# Patient Record
Sex: Male | Born: 1950 | Marital: Married | Smoking: Never smoker
Health system: Southern US, Community
[De-identification: ages and names within clinical notes are randomized; demographics above are authoritative.]

---

## 2019-12-03 DIAGNOSIS — D500 Iron deficiency anemia secondary to blood loss (chronic): Secondary | ICD-10-CM | POA: Insufficient documentation

## 2019-12-03 DIAGNOSIS — C61 Malignant neoplasm of prostate: Secondary | ICD-10-CM | POA: Insufficient documentation

## 2020-05-22 ENCOUNTER — Encounter: Payer: Self-pay | Admitting: Urology

## 2020-05-22 ENCOUNTER — Ambulatory Visit (INDEPENDENT_AMBULATORY_CARE_PROVIDER_SITE_OTHER): Payer: Medicare Other | Admitting: Urology

## 2020-05-22 ENCOUNTER — Other Ambulatory Visit: Payer: Self-pay

## 2020-05-22 VITALS — BP 105/66 | HR 78 | Ht 75.0 in | Wt 150.0 lb

## 2020-05-22 DIAGNOSIS — N529 Male erectile dysfunction, unspecified: Secondary | ICD-10-CM

## 2020-05-22 DIAGNOSIS — C61 Malignant neoplasm of prostate: Secondary | ICD-10-CM

## 2020-05-22 DIAGNOSIS — N3281 Overactive bladder: Secondary | ICD-10-CM

## 2020-05-22 MED ORDER — TAMSULOSIN HCL 0.4 MG PO CAPS: 0.4000 mg | ORAL_CAPSULE | Freq: Every day | ORAL | 3 refills | Status: AC

## 2020-05-22 MED ORDER — TADALAFIL 5 MG PO TABS: ORAL_TABLET | ORAL | 11 refills | Status: DC

## 2020-05-22 NOTE — Patient Instructions (Signed)
Overactive Bladder, Adult  Overactive bladder refers to a condition in which a person has a sudden need to pass urine. The person may leak urine if he or she cannot get to the bathroom fast enough (urinary incontinence). A person with this condition may also wake up several times in the night to go to the bathroom. Overactive bladder is associated with poor nerve signals between your bladder and your brain. Your bladder may get the signal to empty before it is full. You may also have very sensitive muscles that make your bladder squeeze too soon. These symptoms might interfere with daily work or social activities. What are the causes? This condition may be associated with or caused by:  Urinary tract infection.  Infection of nearby tissues, such as the prostate.  Prostate enlargement.  Surgery on the uterus or urethra.  Bladder stones, inflammation, or tumors.  Drinking too much caffeine or alcohol.  Certain medicines, especially medicines that get rid of extra fluid in the body (diuretics).  Muscle or nerve weakness, especially from: ? A spinal cord injury. ? Stroke. ? Multiple sclerosis. ? Parkinson's disease.  Diabetes.  Constipation. What increases the risk? You may be at greater risk for overactive bladder if you:  Are an older adult.  Smoke.  Are going through menopause.  Have prostate problems.  Have a neurological disease, such as stroke, dementia, Parkinson's disease, or multiple sclerosis (MS).  Eat or drink things that irritate the bladder. These include alcohol, spicy food, and caffeine.  Are overweight or obese. What are the signs or symptoms? Symptoms of this condition include:  Sudden, strong urge to urinate.  Leaking urine.  Urinating 8 or more times a day.  Waking up to urinate 2 or more times a night. How is this diagnosed? Your health care provider may suspect overactive bladder based on your symptoms. He or she will diagnose this condition  by:  A physical exam and medical history.  Blood or urine tests. You might need bladder or urine tests to help determine what is causing your overactive bladder. You might also need to see a health care provider who specializes in urinary tract problems (urologist). How is this treated? Treatment for overactive bladder depends on the cause of your condition and whether it is mild or severe. You can also make lifestyle changes at home. Options include:  Bladder training. This may include: ? Learning to control the urge to urinate by following a schedule that directs you to urinate at regular intervals (timed voiding). ? Doing Kegel exercises to strengthen your pelvic floor muscles, which support your bladder. Toning these muscles can help you control urination, even if your bladder muscles are overactive.  Special devices. This may include: ? Biofeedback, which uses sensors to help you become aware of your body's signals. ? Electrical stimulation, which uses electrodes placed inside the body (implanted) or outside the body. These electrodes send gentle pulses of electricity to strengthen the nerves or muscles that control the bladder. ? Women may use a plastic device that fits into the vagina and supports the bladder (pessary).  Medicines. ? Antibiotics to treat bladder infection. ? Antispasmodics to stop the bladder from releasing urine at the wrong time. ? Tricyclic antidepressants to relax bladder muscles. ? Injections of botulinum toxin type A directly into the bladder tissue to relax bladder muscles.  Lifestyle changes. This may include: ? Weight loss. Talk to your health care provider about weight loss methods that would work best for you. ?   Diet changes. This may include reducing how much alcohol and caffeine you consume, or drinking fluids at different times of the day. ? Not smoking. Do not use any products that contain nicotine or tobacco, such as cigarettes and e-cigarettes. If  you need help quitting, ask your health care provider.  Surgery. ? A device may be implanted to help manage the nerve signals that control urination. ? An electrode may be implanted to stimulate electrical signals in the bladder. ? A procedure may be done to change the shape of the bladder. This is done only in very severe cases. Follow these instructions at home: Lifestyle  Make any diet or lifestyle changes that are recommended by your health care provider. These may include: ? Drinking less fluid or drinking fluids at different times of the day. ? Cutting down on caffeine or alcohol. ? Doing Kegel exercises. ? Losing weight if needed. ? Eating a healthy and balanced diet to prevent constipation. This may include:  Eating foods that are high in fiber, such as fresh fruits and vegetables, whole grains, and beans.  Limiting foods that are high in fat and processed sugars, such as fried and sweet foods. General instructions  Take over-the-counter and prescription medicines only as told by your health care provider.  If you were prescribed an antibiotic medicine, take it as told by your health care provider. Do not stop taking the antibiotic even if you start to feel better.  Use any implants or pessary as told by your health care provider.  If needed, wear pads to absorb urine leakage.  Keep a journal or log to track how much and when you drink and when you feel the need to urinate. This will help your health care provider monitor your condition.  Keep all follow-up visits as told by your health care provider. This is important. Contact a health care provider if:  You have a fever.  Your symptoms do not get better with treatment.  Your pain and discomfort get worse.  You have more frequent urges to urinate. Get help right away if:  You are not able to control your bladder. Summary  Overactive bladder refers to a condition in which a person has a sudden need to pass  urine.  Several conditions may lead to an overactive bladder.  Treatment for overactive bladder depends on the cause and severity of your condition.  Follow your health care provider's instructions about lifestyle changes, doing Kegel exercises, keeping a journal, and taking medicines. This information is not intended to replace advice given to you by your health care provider. Make sure you discuss any questions you have with your health care provider. Document Revised: 11/09/2018 Document Reviewed: 08/04/2017 Elsevier Patient Education  [place]. Tadalafil tablets (Cialis) What is this medicine? TADALAFIL (tah DA la fil) is used to treat erection problems in men. It is also used for enlargement of the prostate gland in men, a condition called benign prostatic hyperplasia or BPH. This medicine improves urine flow and reduces BPH symptoms. This medicine can also treat both erection problems and BPH when they occur together. This medicine may be used for other purposes; ask your health care provider or pharmacist if you have questions. COMMON BRAND NAME(S): Kathaleen Bury, Cialis What should I tell my health care provider before I take this medicine? They need to know if you have any of these conditions:  bleeding disorders  eye or vision problems, including a rare inherited eye disease called retinitis pigmentosa  anatomical deformation of the penis, Peyronie's disease, or history of priapism (painful and prolonged erection)  heart disease, angina, a history of heart attack, irregular heart beats, or other heart problems  high or low blood pressure  history of blood diseases, like sickle cell anemia or leukemia  history of stomach bleeding  kidney disease  liver disease  stroke  an unusual or allergic reaction to tadalafil, other medicines, foods, dyes, or preservatives  pregnant or trying to get pregnant  breast-feeding How should I use this medicine? Take this  medicine by mouth with a glass of water. Follow the directions on the prescription label. You may take this medicine with or without meals. When this medicine is used for erection problems, your doctor may prescribe it to be taken once daily or as needed. If you are taking the medicine as needed, you may be able to have sexual activity 30 minutes after taking it and for up to 36 hours after taking it. Whether you are taking the medicine as needed or once daily, you should not take more than one dose per day. If you are taking this medicine for symptoms of benign prostatic hyperplasia (BPH) or to treat both BPH and an erection problem, take the dose once daily at about the same time each day. Do not take your medicine more often than directed. Talk to your pediatrician regarding the use of this medicine in children. Special care may be needed. Overdosage: If you think you have taken too much of this medicine contact a poison control center or emergency room at once. NOTE: This medicine is only for you. Do not share this medicine with others. What if I miss a dose? If you are taking this medicine as needed for erection problems, this does not apply. If you miss a dose while taking this medicine once daily for an erection problem, benign prostatic hyperplasia, or both, take it as soon as you remember, but do not take more than one dose per day. What may interact with this medicine? Do not take this medicine with any of the following medications:  nitrates like amyl nitrite, isosorbide dinitrate, isosorbide mononitrate, nitroglycerin  other medicines for erectile dysfunction like avanafil, sildenafil, vardenafil  other tadalafil products (Adcirca)  riociguat This medicine may also interact with the following medications:  certain drugs for high blood pressure  certain drugs for the treatment of HIV infection or AIDS  certain drugs used for fungal or yeast infections, like fluconazole, itraconazole,  ketoconazole, and voriconazole  certain drugs used for seizures like carbamazepine, phenytoin, and phenobarbital  grapefruit juice  macrolide antibiotics like clarithromycin, erythromycin, troleandomycin  medicines for prostate problems  rifabutin, rifampin or rifapentine This list may not describe all possible interactions. Give your health care provider a list of all the medicines, herbs, non-prescription drugs, or dietary supplements you use. Also tell them if you smoke, drink alcohol, or use illegal drugs. Some items may interact with your medicine. What should I watch for while using this medicine? If you notice any changes in your vision while taking this drug, call your doctor or health care professional as soon as possible. Stop using this medicine and call your health care provider right away if you have a loss of sight in one or both eyes. Contact your doctor or health care professional right away if the erection lasts longer than 4 hours or if it becomes painful. This may be a sign of serious problem and must be treated right away to  prevent permanent damage. If you experience symptoms of nausea, dizziness, chest pain or arm pain upon initiation of sexual activity after taking this medicine, you should refrain from further activity and call your doctor or health care professional as soon as possible. Do not drink alcohol to excess (examples, 5 glasses of wine or 5 shots of whiskey) when taking this medicine. When taken in excess, alcohol can increase your chances of getting a headache or getting dizzy, increasing your heart rate or lowering your blood pressure. Using this medicine does not protect you or your partner against HIV infection (the virus that causes AIDS) or other sexually transmitted diseases. What side effects may I notice from receiving this medicine? Side effects that you should report to your doctor or health care professional as soon as possible:  allergic reactions  like skin rash, itching or hives, swelling of the face, lips, or tongue  breathing problems  changes in hearing  changes in vision  chest pain  fast, irregular heartbeat  prolonged or painful erection  seizures Side effects that usually do not require medical attention (report to your doctor or health care professional if they continue or are bothersome):  back pain  dizziness  flushing  headache  indigestion  muscle aches  nausea  stuffy or runny nose This list may not describe all possible side effects. Call your doctor for medical advice about side effects. You may report side effects to FDA at 1-800-FDA-1088. Where should I keep my medicine? Keep out of the reach of children. Store at room temperature between 15 and 30 degrees C (59 and 86 degrees F). Throw away any unused medicine after the expiration date. NOTE: This sheet is a summary. It may not cover all possible information. If you have questions about this medicine, talk to your doctor, pharmacist, or health care provider.  2020 Elsevier/Gold Standard (2013-12-07 13:15:49)

## 2020-05-22 NOTE — Progress Notes (Signed)
   05/22/20 2:23 PM   Jahlen Marvel Plan 21-Nov-1950 [id number]  CC: History of prostate cancer, ED, urinary symptoms  HPI: I saw Mr. Mehlhoff for the above issues.  He recently moved to the area from [place].  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  His PSA was mildly elevated at 4.16 which prompted a prostate biopsy showing Gleason score 3+4 equal 7 and 4/12 cores at the right base with 25% max core involvement.  His urologist in [place] was Dr. Jon Billings.  His primary complaint today is urinary frequency and vague bladder discomfort with urination, this was present even before he had radiation.  He drinks primarily green tea and water during the day.  He has been on Flomax long-term which she does think improves his urinary symptoms.  He also has nocturia 2-3 times overnight.  He denies any incontinence, UTIs, or gross hematuria.  He also has had trouble with erections over the last few years, and is interested in trying medications.  He is never tried any medications for this previously.  Finally, he has had some anemia and leukopenia since radiation, and wonders if this could be related to his radiation.  A testosterone was checked last July 2020 and was normal at 355.  Most recent PSA on 12/03/2019 was 0.12, PSA was 0.51 in July 2020, and 0.3 in November 2019.   Physical Exam: BP 105/66 (BP Location: Left Arm, Patient Position: Sitting, Cuff Size: Normal)   Pulse 78   Ht 6\' 3"  (1.905 m)   Wt 150 lb (68 kg)   BMI 18.75 kg/m    Constitutional:  Alert and oriented, No acute distress. Cardiovascular: No clubbing, cyanosis, or edema. Respiratory: Normal respiratory effort, no increased work of breathing. GI: Abdomen is soft, nontender, nondistended, no abdominal masses  Laboratory Data: Reviewed, see HPI  Pertinent Imaging: None to  review  Assessment & Plan:   In summary, is a 70 year old male with a history of favorable intermediate risk prostate cancer treated with brachytherapy and external beam radiation in [place] in early 2018, and PSA has been appropriately low since that time, most recently 0.12 in May 2021.  He has mild to moderate urinary frequency and bladder discomfort that was present even before radiation, prior urinalyses have been benign.  We discussed behavioral strategies including avoiding bladder irritants like tea and timed voiding.  Regarding his ED, I recommended a trial of Cialis, and we discussed the risks and benefits of this medication at length.  -Continue Flomax, refilled -Trial of Cialis 5 to 10 mg on demand for ED -Follow-up for lab visit for AM testosterone, call with results and to check on urinary symptoms.  If urinary symptoms increasingly bothersome would recommend cystoscopy for further evaluation, or consider trial of an anticholinergic   Nickolas Madrid, MD 05/22/2020  [facility] [address] [place], [place] [address] [phone]

## 2020-06-04 ENCOUNTER — Other Ambulatory Visit: Payer: Medicare Other

## 2020-06-04 ENCOUNTER — Other Ambulatory Visit: Payer: Self-pay

## 2020-06-04 DIAGNOSIS — C61 Malignant neoplasm of prostate: Secondary | ICD-10-CM

## 2020-06-05 ENCOUNTER — Telehealth: Payer: Self-pay

## 2020-06-05 LAB — TESTOSTERONE: Testosterone: 434 ng/dL (ref 264–916)

## 2020-06-05 NOTE — Telephone Encounter (Signed)
-----   Message from Billey Co, MD sent at 06/05/2020  8:03 AM EDT ----- Testosterone level is normal.  If his urinary symptoms are not improving with behavioral strategies that we discussed, okay to schedule cystoscopy if he would like to pursue further work-up for his urinary symptoms.  Nickolas Madrid, MD 06/05/2020

## 2020-06-05 NOTE — Telephone Encounter (Signed)
Called pt informed him of the information below. Pt gave verbal understanding. Declines appt at this time, will call back if needed in the future.

## 2020-09-16 ENCOUNTER — Other Ambulatory Visit: Payer: Self-pay | Admitting: Internal Medicine

## 2020-09-16 DIAGNOSIS — R1031 Right lower quadrant pain: Secondary | ICD-10-CM

## 2020-09-25 ENCOUNTER — Ambulatory Visit
Admission: RE | Admit: 2020-09-25 | Discharge: 2020-09-25 | Disposition: A | Discharge status: Home / Self Care | Payer: Medicare Other | Source: Ambulatory Visit | Attending: Internal Medicine | Admitting: Internal Medicine

## 2020-09-25 ENCOUNTER — Other Ambulatory Visit: Payer: Self-pay

## 2020-09-25 DIAGNOSIS — R1031 Right lower quadrant pain: Secondary | ICD-10-CM | POA: Insufficient documentation

## 2020-10-01 ENCOUNTER — Other Ambulatory Visit: Payer: Self-pay

## 2020-10-01 ENCOUNTER — Ambulatory Visit: Payer: Self-pay

## 2020-10-01 ENCOUNTER — Other Ambulatory Visit: Payer: Self-pay | Admitting: Occupational Medicine

## 2020-10-01 DIAGNOSIS — Z0000 Encounter for general adult medical examination without abnormal findings: Secondary | ICD-10-CM

## 2021-05-25 DIAGNOSIS — N529 Male erectile dysfunction, unspecified: Secondary | ICD-10-CM | POA: Insufficient documentation

## 2021-12-24 ENCOUNTER — Other Ambulatory Visit: Payer: Self-pay | Admitting: Internal Medicine

## 2021-12-24 DIAGNOSIS — R109 Unspecified abdominal pain: Secondary | ICD-10-CM

## 2021-12-24 DIAGNOSIS — R1013 Epigastric pain: Secondary | ICD-10-CM

## 2021-12-24 DIAGNOSIS — R634 Abnormal weight loss: Secondary | ICD-10-CM

## 2021-12-31 ENCOUNTER — Ambulatory Visit
Admission: RE | Admit: 2021-12-31 | Discharge: 2021-12-31 | Disposition: A | Discharge status: Home / Self Care | Payer: Medicare Other | Source: Ambulatory Visit | Attending: Internal Medicine | Admitting: Internal Medicine

## 2021-12-31 DIAGNOSIS — R634 Abnormal weight loss: Secondary | ICD-10-CM

## 2021-12-31 DIAGNOSIS — R1013 Epigastric pain: Secondary | ICD-10-CM

## 2021-12-31 DIAGNOSIS — R109 Unspecified abdominal pain: Secondary | ICD-10-CM

## 2021-12-31 MED ORDER — IOPAMIDOL (ISOVUE-300) INJECTION 61%
100.0000 mL | Freq: Once | INTRAVENOUS | Status: AC | PRN
  Administered 2021-12-31: 100 mL via INTRAVENOUS

## 2022-01-02 IMAGING — US US EXTREM LOW*R* LIMITED
1 series · 14 of 15 positions shown · non-contrast
Comparison: None.

CLINICAL DATA: Right inguinal region pain

EXAM:
ULTRASOUND RIGHT INGUINAL REGION
TECHNIQUE: Longitudinal and transverse images of the right inguinal region
evident.

[Series 1: us extrem low*right* limited · 0.07mm/px · 15 acquisitions, 14 frames shown]
[im 1/15]
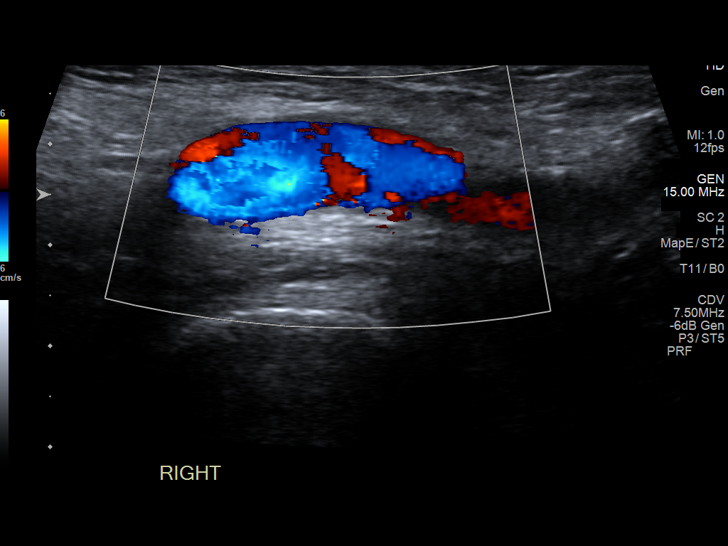
[im 2/15]
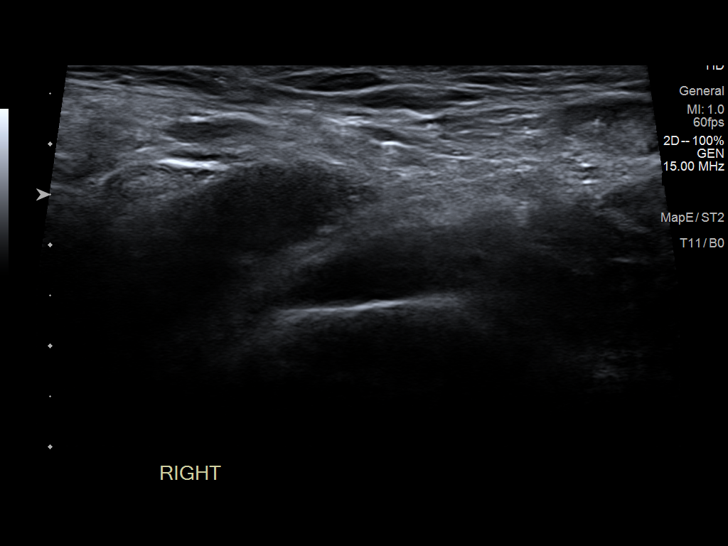
[im 3/15]
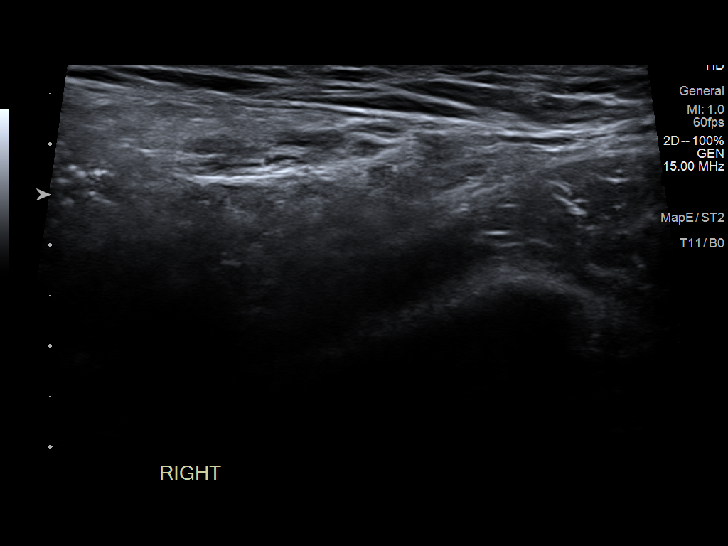
[im 4/15]
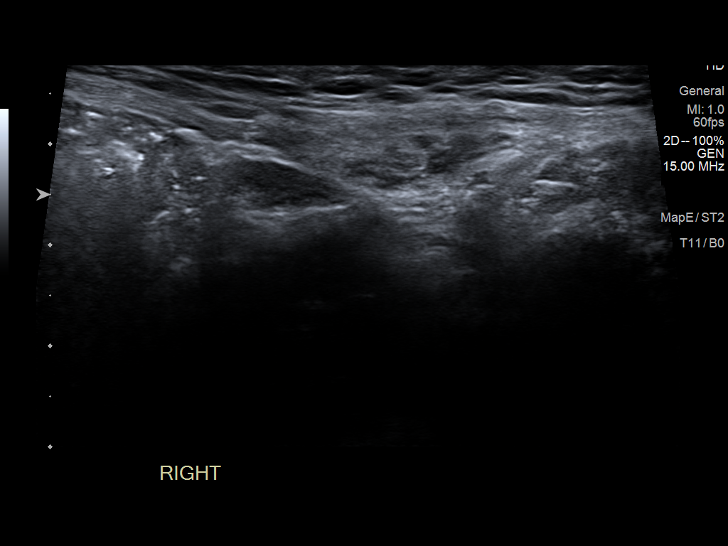
[im 5/15]
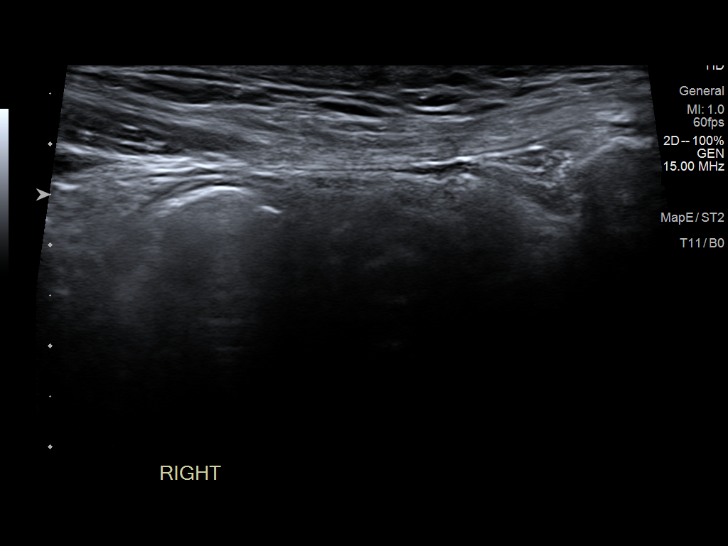
[im 6/15]
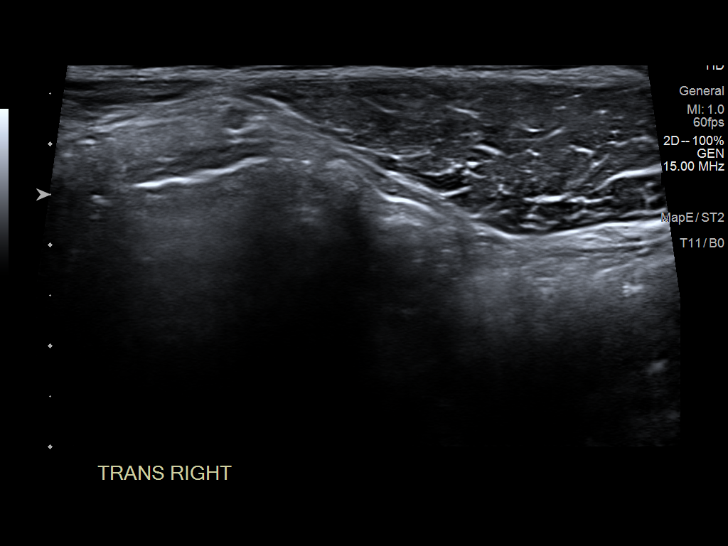
[im 7/15]
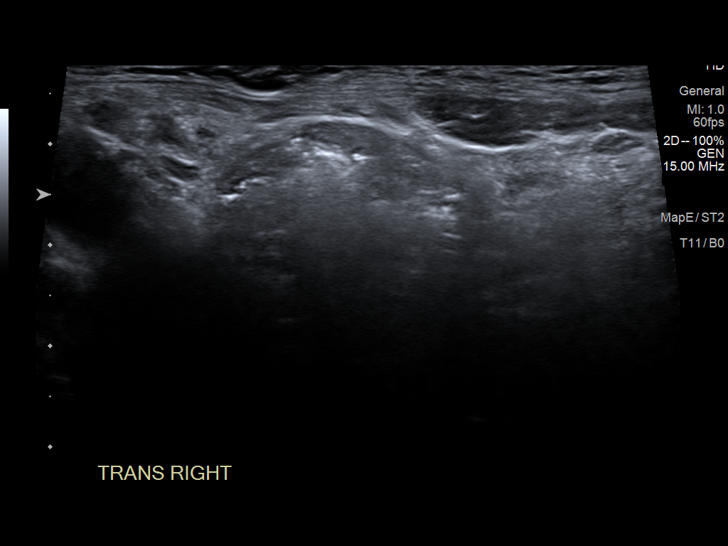
[im 9/15]
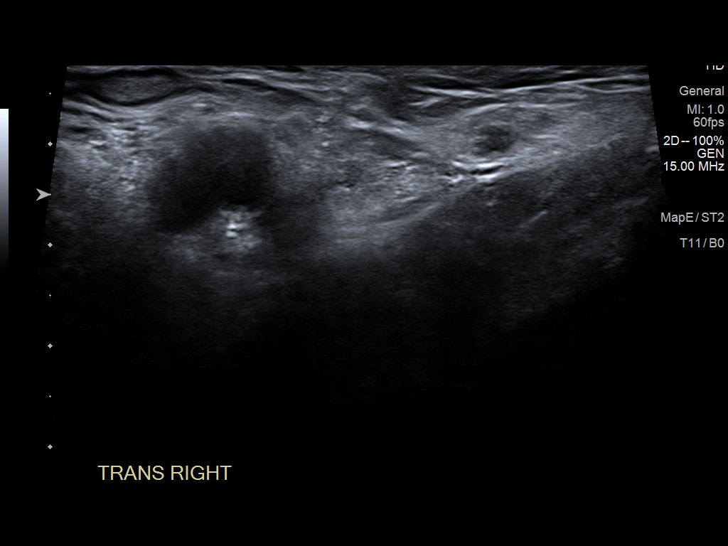
[im 10/15]
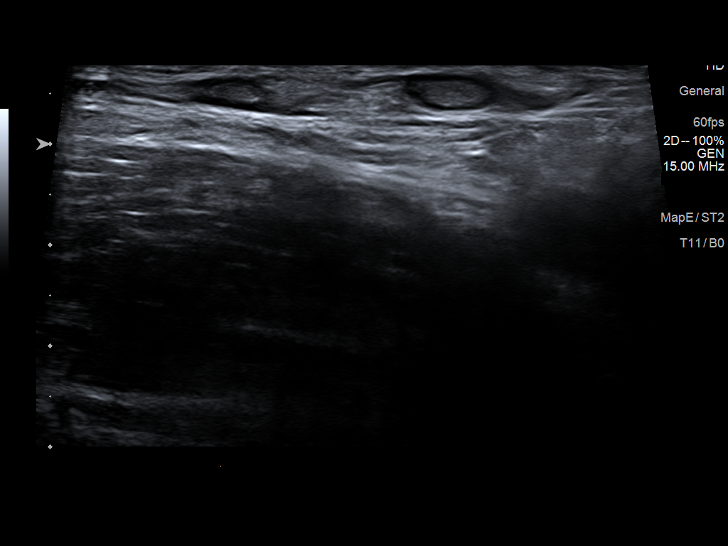
[im 11/15]
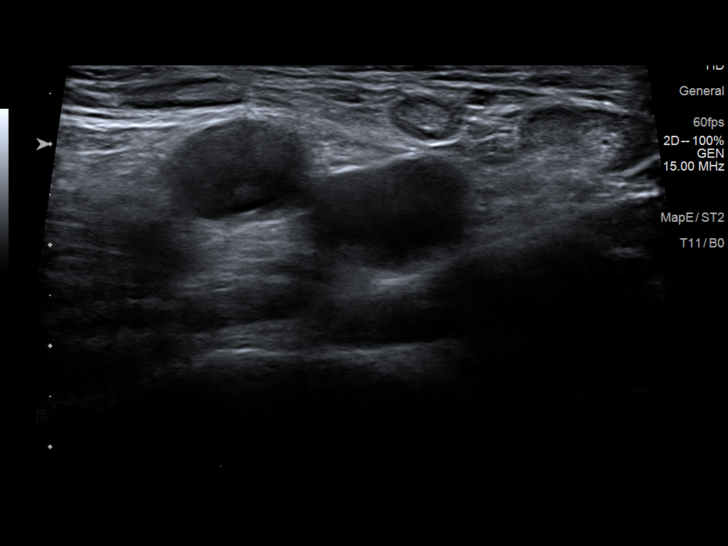
[im 12/15]
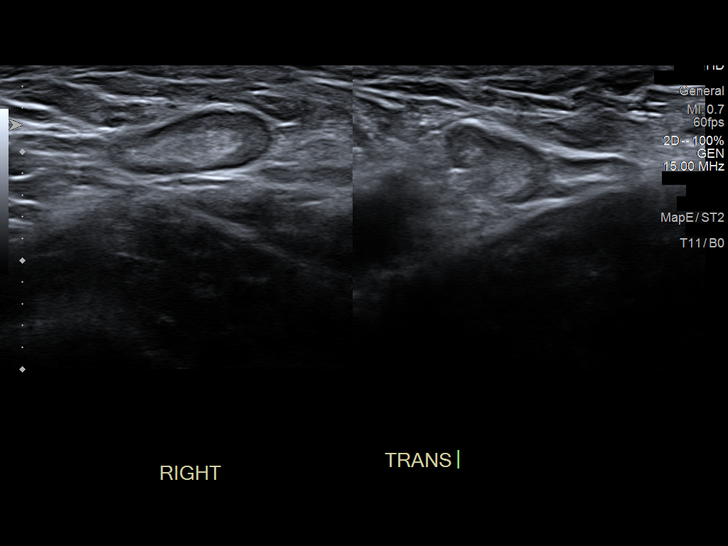
[im 13/15]
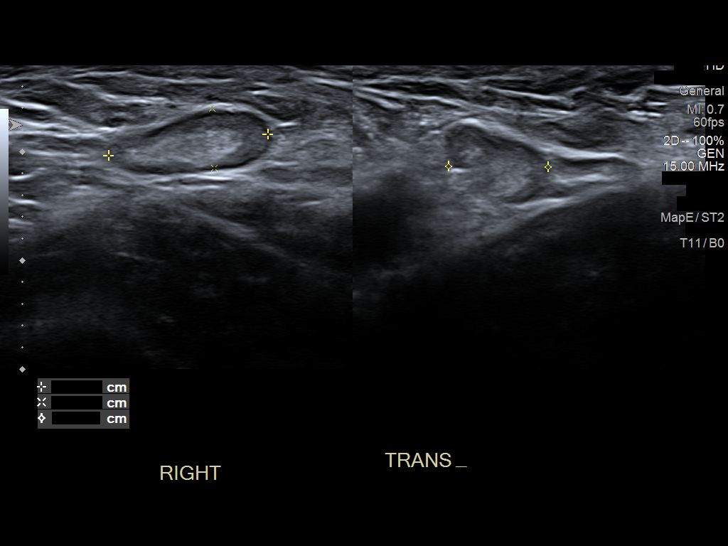
[im 14/15]
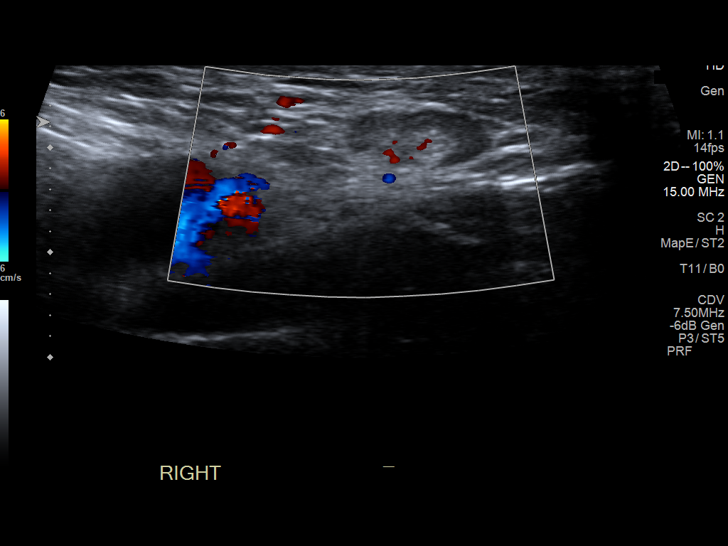
[im 15/15]
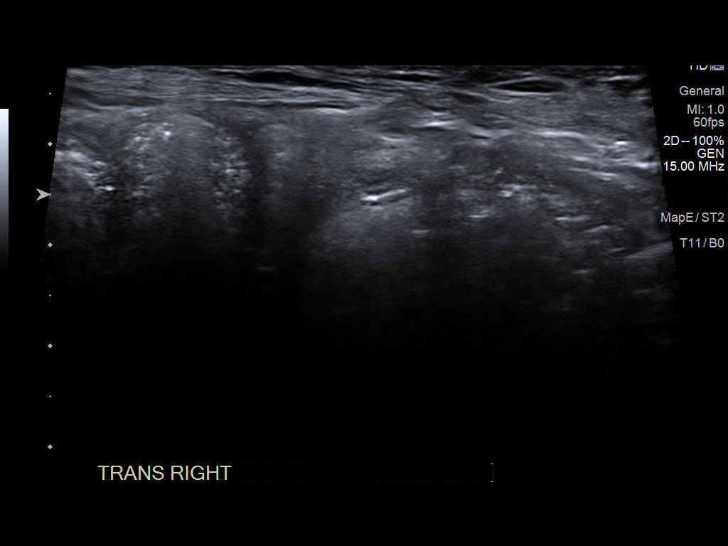

[14 of 15 positions shown; findings below may reference images not displayed]

FINDINGS: No evident hernia in the right inguinal region by ultrasound. There
are several benign-appearing lymph nodes in the right inguinal
region. These lymph nodes have an ovoid shape with central fatty
hila. Largest of these lymph nodes measures 1.5 x 0.6 x 0.9 cm. No
fluid or inflammatory foci in the right inguinal region.
IMPRESSION: Several benign-appearing lymph nodes in the right inguinal region,
likely of reactive etiology. No mass apart from lymph nodes evident.
No inflammatory focus or abnormal fluid. No hernia appreciable by
ultrasound.

## 2022-01-08 IMAGING — DX DG CHEST 2V
2 series · 2 of 2 positions shown · non-contrast
Comparison: None.

CLINICAL DATA: Physical exam.  Occupational exposure

EXAM:
CHEST - 2 VIEW

[chest pa]
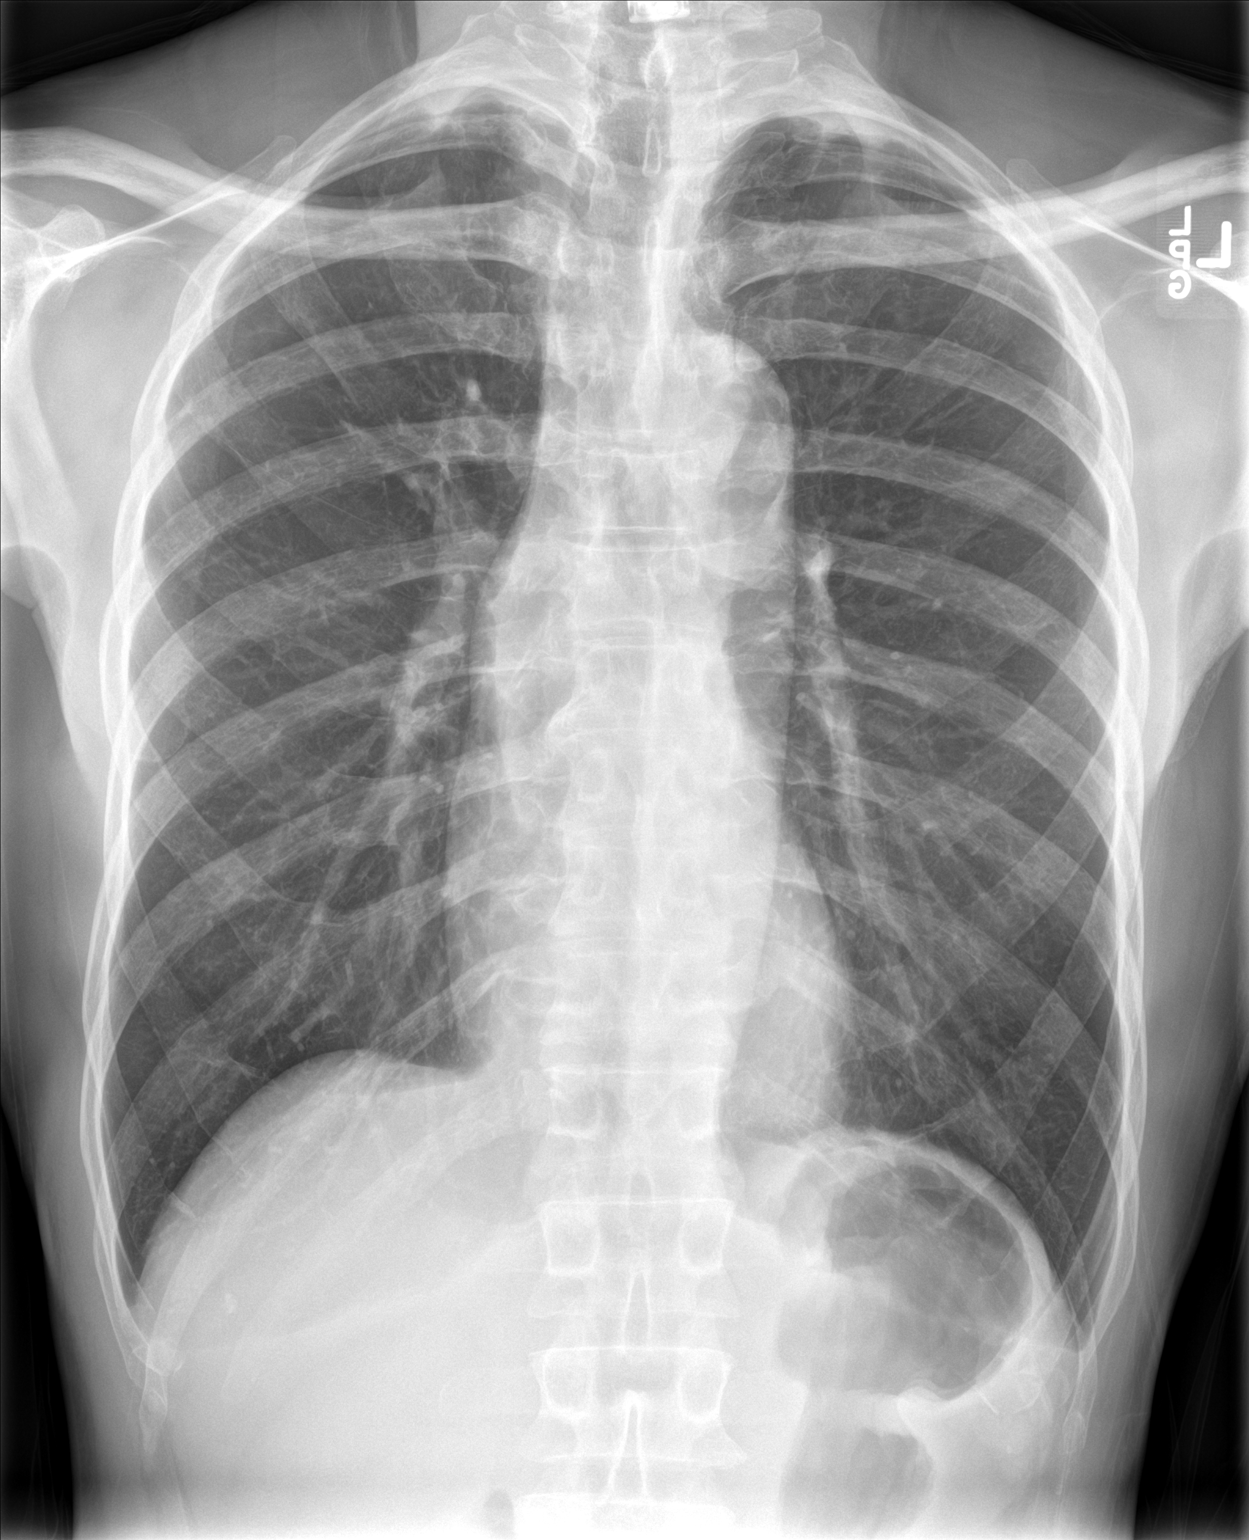

[chest lat]
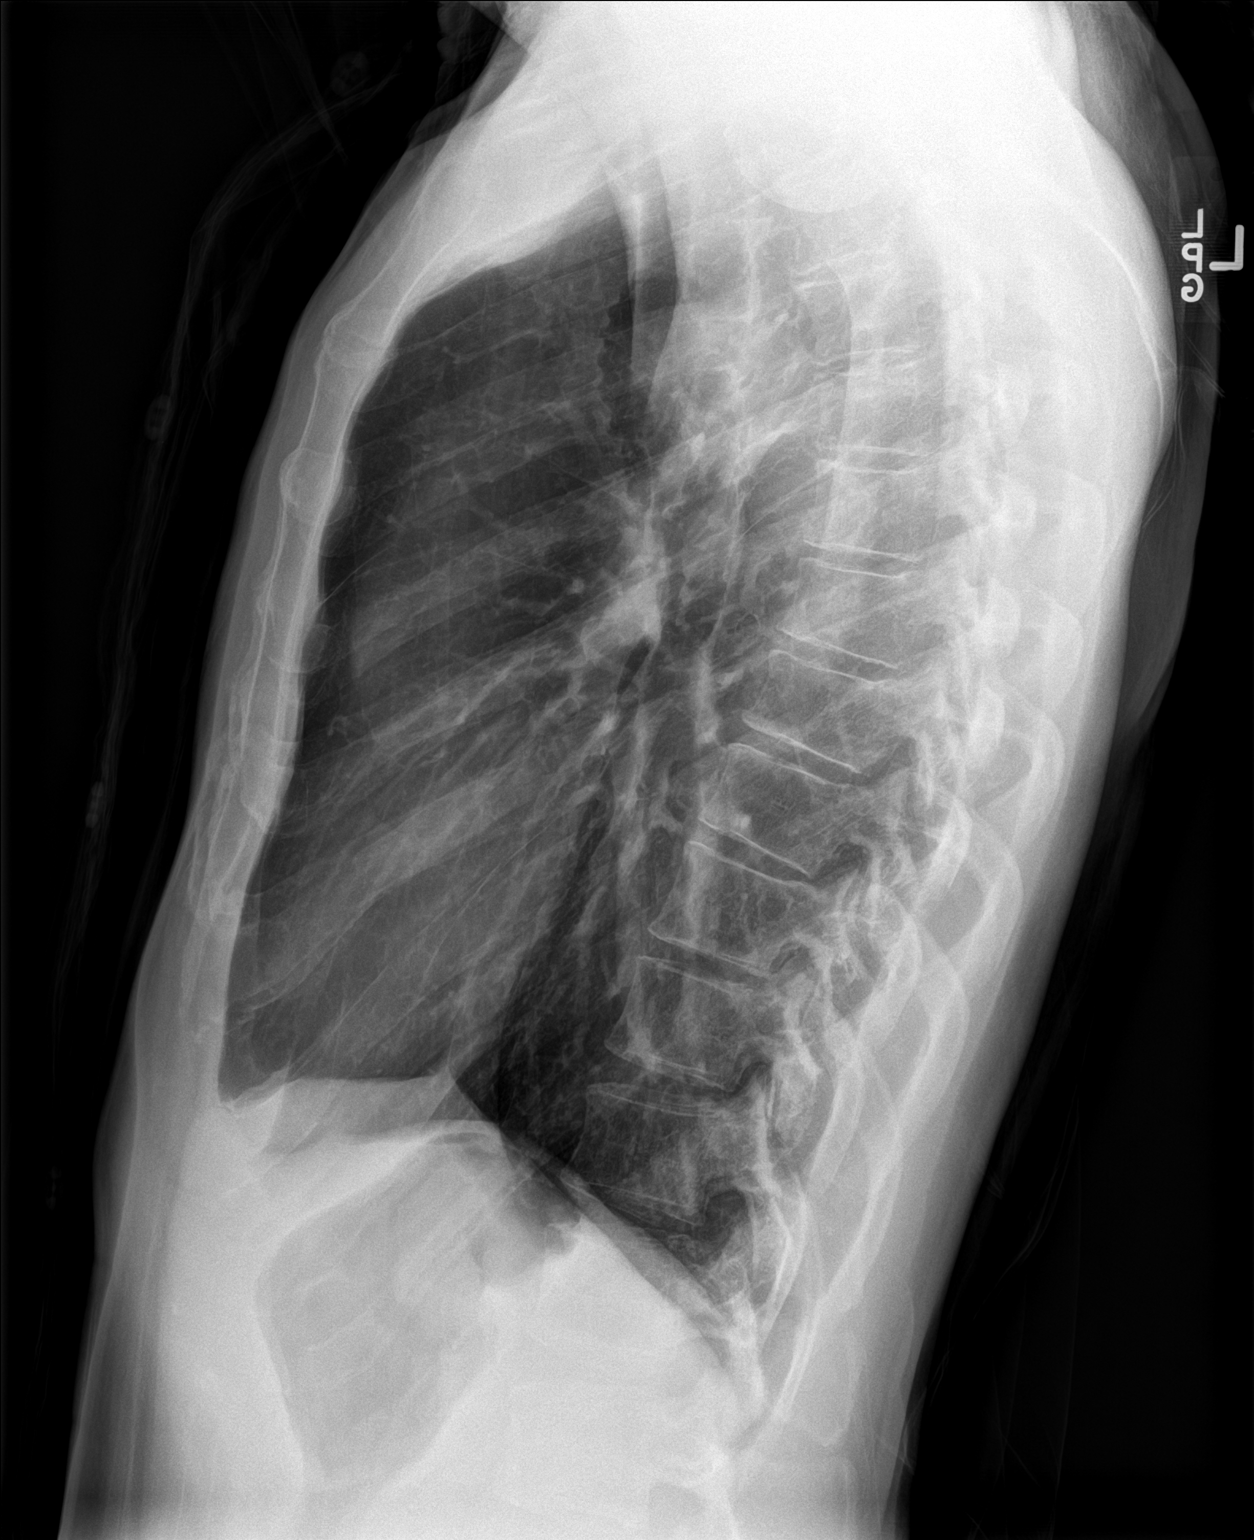

[2 of 2 positions shown; findings below may reference images not displayed]

FINDINGS: Normal heart size and mediastinal contours. Symmetric biapical
pleural thickening/scarring. There is no edema, consolidation,
effusion, or pneumothorax. No significant osseous finding.
IMPRESSION: Negative chest.

## 2022-01-19 ENCOUNTER — Other Ambulatory Visit: Payer: Self-pay

## 2022-01-19 ENCOUNTER — Inpatient Hospital Stay: Payer: Medicare Other | Attending: Internal Medicine | Admitting: Internal Medicine

## 2022-01-19 ENCOUNTER — Encounter: Payer: Self-pay | Admitting: Internal Medicine

## 2022-01-19 ENCOUNTER — Inpatient Hospital Stay: Payer: Medicare Other

## 2022-01-19 VITALS — BP 110/73 | HR 61 | Temp 98.7°F | Resp 20 | Wt 147.2 lb

## 2022-01-19 DIAGNOSIS — Z8546 Personal history of malignant neoplasm of prostate: Secondary | ICD-10-CM | POA: Insufficient documentation

## 2022-01-19 DIAGNOSIS — D708 Other neutropenia: Secondary | ICD-10-CM | POA: Diagnosis not present

## 2022-01-19 DIAGNOSIS — D72810 Lymphocytopenia: Secondary | ICD-10-CM | POA: Insufficient documentation

## 2022-01-19 DIAGNOSIS — D649 Anemia, unspecified: Secondary | ICD-10-CM

## 2022-01-19 DIAGNOSIS — D72818 Other decreased white blood cell count: Secondary | ICD-10-CM | POA: Diagnosis not present

## 2022-01-19 DIAGNOSIS — E611 Iron deficiency: Secondary | ICD-10-CM | POA: Diagnosis not present

## 2022-01-19 LAB — HEPATITIS B CORE ANTIBODY, IGM: Hep B C IgM: NONREACTIVE

## 2022-01-19 LAB — CBC WITH DIFFERENTIAL/PLATELET
Abs Immature Granulocytes: 0 10*3/uL (ref 0.00–0.07)
Basophils Absolute: 0 10*3/uL (ref 0.0–0.1)
Basophils Relative: 0 %
Eosinophils Absolute: 0 10*3/uL (ref 0.0–0.5)
Eosinophils Relative: 1 %
HCT: 36 % — ABNORMAL LOW (ref 39.0–52.0)
Hemoglobin: 12 g/dL — ABNORMAL LOW (ref 13.0–17.0)
Immature Granulocytes: 0 %
Lymphocytes Relative: 31 %
Lymphs Abs: 1.1 10*3/uL (ref 0.7–4.0)
MCH: 30.9 pg (ref 26.0–34.0)
MCHC: 33.3 g/dL (ref 30.0–36.0)
MCV: 92.8 fL (ref 80.0–100.0)
Monocytes Absolute: 0.4 10*3/uL (ref 0.1–1.0)
Monocytes Relative: 10 %
Neutro Abs: 2 10*3/uL (ref 1.7–7.7)
Neutrophils Relative %: 58 %
Platelets: 179 10*3/uL (ref 150–400)
RBC: 3.88 MIL/uL — ABNORMAL LOW (ref 4.22–5.81)
RDW: 14.3 % (ref 11.5–15.5)
WBC: 3.5 10*3/uL — ABNORMAL LOW (ref 4.0–10.5)
nRBC: 0 % (ref 0.0–0.2)

## 2022-01-19 LAB — TECHNOLOGIST SMEAR REVIEW
Plt Morphology: NORMAL
RBC MORPHOLOGY: NORMAL
WBC MORPHOLOGY: NORMAL

## 2022-01-19 LAB — HEPATITIS B SURFACE ANTIGEN: Hepatitis B Surface Ag: NONREACTIVE

## 2022-01-19 LAB — RETICULOCYTES
Immature Retic Fract: 4.7 % (ref 2.3–15.9)
RBC.: 3.84 MIL/uL — ABNORMAL LOW (ref 4.22–5.81)
Retic Count, Absolute: 45.7 10*3/uL (ref 19.0–186.0)
Retic Ct Pct: 1.2 % (ref 0.4–3.1)

## 2022-01-19 LAB — VITAMIN B12: Vitamin B-12: 1233 pg/mL — ABNORMAL HIGH (ref 180–914)

## 2022-01-19 LAB — IRON AND TIBC
Iron: 102 ug/dL (ref 45–182)
Saturation Ratios: 30 % (ref 17.9–39.5)
TIBC: 346 ug/dL (ref 250–450)
UIBC: 244 ug/dL

## 2022-01-19 LAB — FERRITIN: Ferritin: 35 ng/mL (ref 24–336)

## 2022-01-19 LAB — LACTATE DEHYDROGENASE: LDH: 119 U/L (ref 98–192)

## 2022-01-19 LAB — HEPATITIS C ANTIBODY: HCV Ab: NONREACTIVE

## 2022-01-19 NOTE — Assessment & Plan Note (Deleted)
#   Anemia:  # Etilology:   # Mild Leucopenia:   # Prostate cancer: s/p EBRT [2018]- followed Urologist, [facility] [[facility]]  Thank you Mr.Crowley for allowing me to participate in the care of your pleasant patient. Please do not hesitate to contact me with questions or concerns in the interim.    # DISPOSITION: # labs today # follow up in the week of July 3rd- MD; no labs- possible venofer-Dr.B

## 2022-01-19 NOTE — Assessment & Plan Note (Signed)
#   Anemia: Hemoglobin 11 [April 2023; iron saturation 26; ferritin 22-lower limit 23 PCP]-etiology is unclear.  Discussed the multiple etiologies include-blood loss/loss of absorption vs. nutritional slightly.  Again doubt any malignancy although patient exposed to radiation 5 years ago for his prostate cancer.  We will check CBC CMP; LDH haptoglobin reticulocyte count review of smear; iron studies ferritin B12.   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  Question external beam RT/viral infections versus others.  Doubt any serious malignancies-again await above work-up.   #Fatigue: ?  Etiology await above work-up.   # Prostate cancer: s/p EBRT [2018]- followed Urologist, [place]; PSA 2023-  Thank you Mr.Crowley for allowing me to participate in the care of your pleasant patient. Please do not hesitate to contact me with questions or concerns in the interim.  # DISPOSITION: # labs today # follow up in the week of July 3rd- MD; no labs- possible venofer-Dr.B

## 2022-01-19 NOTE — Progress Notes (Signed)
[facility] NOTE  Patient Care Team: Gladstone Lighter, MD as PCP - General (Internal Medicine) Cammie Sickle, MD as Consulting Physician (Oncology)  CHIEF COMPLAINTS/PURPOSE OF CONSULTATION: ANEMIA   HEMATOLOGY HISTORY  # ANEMIA[ APRIL- 2023-;PCP; Hb12 platelets- WBC;-2.3 [ANC/Lymphopenia]; Iron sat-24%; ferritin-22 [Lower limit- >23];  GFR-80 CT/US- ;  EGD/colonoscopy-[early 2020]- MAY 2023- [facility] [mason [facility]]  # PROSTATE CANCER [2018; "Gleason score=7"] s/p EBRT+ seed implants  HISTORY OF PRESENTING ILLNESS:  Shane [name] 71 y.o.  male pleasant patient was been referred to [place] for further evaluation of anemia.  Patient was recently evaluated by GI for abdominal discomfort; and anemia.  Patient is currently awaiting review of records from of his endoscopies from [place].   Patient admits to chronic fatigue; progressively getting worse.  Blood in stools: none Blood in urine:none Difficulty swallowing: Change of bowel movement/constipation: none Prior blood transfusion: none Prior history of blood loss: none Liver disease:none  Alcohol: none Bariatric surgery: none  Prior evaluation with hematology:none Prior bone marrow biopsy: none Oral iron: prn- causes constipation Prior IV iron infusions:none   Review of Systems  Constitutional:  Positive for malaise/fatigue. Negative for chills, diaphoresis, fever and weight loss.  HENT:  Negative for nosebleeds and sore throat.   Eyes:  Negative for double vision.  Respiratory:  Negative for cough, hemoptysis, sputum production, shortness of breath and wheezing.   Cardiovascular:  Negative for chest pain, palpitations, orthopnea and leg swelling.  Gastrointestinal:  Positive for heartburn. Negative for abdominal pain, blood in stool, constipation, diarrhea, melena, nausea and vomiting.  Genitourinary:  Negative for dysuria, frequency and urgency.  Musculoskeletal:  Positive for joint pain.  Negative for back pain.  Skin: Negative.  Negative for itching and rash.  Neurological:  Negative for dizziness, tingling, focal weakness, weakness and headaches.  Endo/Heme/Allergies:  Does not bruise/bleed easily.  Psychiatric/Behavioral:  Negative for depression. The patient is not nervous/anxious and does not have insomnia.      MEDICAL HISTORY:  History reviewed. No pertinent past medical history.  SURGICAL HISTORY: History reviewed. No pertinent surgical history.  SOCIAL HISTORY: Social History   Socioeconomic History   Marital status: Married    Spouse name: Not on file   Number of children: Not on file   Years of education: Not on file   Highest education level: Not on file  Occupational History   Not on file  Tobacco Use   Smoking status: Never   Smokeless tobacco: Never  Substance and Sexual Activity   Alcohol use: Not Currently   Drug use: Never   Sexual activity: Yes    Birth control/protection: None  Other Topics Concern   Not on file  Social History Narrative      From [place]; worked for [employer]; niece in [place]; 3 children. Never smoked; No alcohol.    Social Determinants of Health   Financial Resource Strain: Not on file  Food Insecurity: Not on file  Transportation Needs: Not on file  Physical Activity: Not on file  Stress: Not on file  Social Connections: Not on file  Intimate Partner Violence: Not on file    FAMILY HISTORY: Family History  Problem Relation Age of Onset   Diabetes Mother     ALLERGIES:  is allergic to milk protein, peanut-containing drug products, and other.  MEDICATIONS:  Current Outpatient Medications  Medication Sig Dispense Refill   acetaminophen (TYLENOL) 500 MG tablet Take by mouth as needed.      celecoxib (  CELEBREX) 200 MG capsule Take by mouth.     omeprazole (PRILOSEC) 40 MG capsule Take by mouth.     tamsulosin (FLOMAX) 0.4 MG CAPS capsule Take 1 capsule (0.4 mg total) by mouth daily. 90 capsule 3    tadalafil (CIALIS) 5 MG tablet Take 1-2 tablets by mouth as need 1hr prior to intercourse 30 tablet 11   No current facility-administered medications for this visit.     Marland Kitchen  PHYSICAL EXAMINATION:   Vitals:   01/19/22 1120  BP: 110/73  Pulse: 61  Resp: 20  Temp: 98.7 F (37.1 C)  SpO2: 100%   Filed Weights   01/19/22 1120  Weight: 147 lb 3.2 oz (66.8 kg)    Physical Exam Vitals and nursing note reviewed.  HENT:     Head: Normocephalic and atraumatic.     Mouth/Throat:     Pharynx: Oropharynx is clear.  Eyes:     Extraocular Movements: Extraocular movements intact.     Pupils: Pupils are equal, round, and reactive to light.  Cardiovascular:     Rate and Rhythm: Normal rate and regular rhythm.  Pulmonary:     Comments: Decreased breath sounds bilaterally.  Abdominal:     Palpations: Abdomen is soft.  Musculoskeletal:        General: Normal range of motion.     Cervical back: Normal range of motion.  Skin:    General: Skin is warm.  Neurological:     General: No focal deficit present.     Mental Status: He is alert and oriented to person, place, and time.  Psychiatric:        Behavior: Behavior normal.        Judgment: Judgment normal.      LABORATORY DATA:  I have reviewed the data as listed No results found for: "WBC", "HGB", "HCT", "MCV", "PLT" No results for input(s): "NA", "K", "CL", "CO2", "GLUCOSE", "BUN", "CREATININE", "CALCIUM", "GFRNONAA", "GFRAA", "PROT", "ALBUMIN", "AST", "ALT", "ALKPHOS", "BILITOT", "BILIDIR", "IBILI" in the last 8760 hours.   CT ABDOMEN PELVIS W CONTRAST  Result Date: 01/02/2022 CLINICAL DATA:  Unintentional weight loss. Recurrent epigastric pain. History of prostate cancer with brachytherapy seed implants EXAM: CT ABDOMEN AND PELVIS WITH CONTRAST TECHNIQUE: Multidetector CT imaging of the abdomen and pelvis was performed using the standard protocol following bolus administration of intravenous contrast. RADIATION DOSE REDUCTION:  This exam was performed according to the departmental dose-optimization program which includes automated exposure control, adjustment of the mA and/or kV according to patient size and/or use of iterative reconstruction technique. CONTRAST:  146m ISOVUE-300 IOPAMIDOL (ISOVUE-300) INJECTION 61% COMPARISON:  None Available. FINDINGS: Lower chest: Lung bases are clear. Hepatobiliary: No focal hepatic lesion. No biliary duct dilatation. Common bile duct is normal. Pancreas: Pancreas is normal. No ductal dilatation. No pancreatic inflammation. Spleen: Normal spleen Adrenals/urinary tract: Adrenal glands are normal. No enhancing renal cortical lesion. No renal obstruction. Ureters and bladder normal. 3.7 cm simple fluid attenuation cyst of the LEFT kidney. Three smaller simple fluid attenuation cyst of the RIGHT kidney. Stomach/Bowel: Stomach, small bowel, appendix, and cecum are normal. The colon and rectosigmoid colon are normal. Moderate volume stool throughout the colon. Vascular/Lymphatic: Abdominal aorta is normal caliber. No periportal or retroperitoneal adenopathy. No pelvic adenopathy. Reproductive: Brachytherapy seeds within the prostate gland. No pelvic lymphadenopathy. No periaortic adenopathy. Other: No free fluid. Musculoskeletal: No suspicious lytic or blastic lesions identified. Sclerotic focus in the LEFT iliac wing measuring 17 mm (image 52/2) is favored benign. Degenerative changes in  lumbar spine at L4-L5. IMPRESSION: 1. No explanation for epigastric pain or weight loss. 2. Bilateral benign Bosniak 2 renal cysts. No specific follow-up recommended. 3. Brachytherapy seeds in the prostate gland. No evidence of prostate cancer metastasis. Electronically Signed   By: Suzy Bouchard M.D.   On: 01/02/2022 09:25    ASSESSMENT & PLAN:   Symptomatic anemia # Anemia: Hemoglobin 11 [April 2023; iron saturation 26; ferritin 22-lower limit 23 PCP]-etiology is unclear.  Discussed the multiple etiologies  include-blood loss/loss of absorption vs. nutritional slightly.  Again doubt any malignancy although patient exposed to radiation 5 years ago for his prostate cancer.  We will check CBC CMP; LDH haptoglobin reticulocyte count review of smear; iron studies ferritin B12.   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  Question external beam RT/viral infections versus others.  Doubt any serious malignancies-again await above work-up.   #Fatigue: ?  Etiology await above work-up.   # Prostate cancer: s/p EBRT [2018]- followed Urologist, [place]; PSA 2023-  Thank you Mr.Crowley for allowing me to participate in the care of your pleasant patient. Please do not hesitate to contact me with questions or concerns in the interim.  # DISPOSITION: # labs today # follow up in the week of July 3rd- MD; no labs- possible venofer-Dr.B    All questions were answered. The patient knows to call the clinic with any problems, questions or concerns.    Cammie Sickle, MD 01/19/2022 12:19 PM

## 2022-01-20 LAB — HAPTOGLOBIN: Haptoglobin: 110 mg/dL (ref 32–363)

## 2022-02-01 ENCOUNTER — Inpatient Hospital Stay: Payer: Medicare Other

## 2022-02-01 ENCOUNTER — Encounter: Payer: Self-pay | Admitting: Internal Medicine

## 2022-02-01 ENCOUNTER — Inpatient Hospital Stay: Payer: Medicare Other | Attending: Internal Medicine | Admitting: Internal Medicine

## 2022-02-01 DIAGNOSIS — G4730 Sleep apnea, unspecified: Secondary | ICD-10-CM | POA: Diagnosis not present

## 2022-02-01 DIAGNOSIS — R5383 Other fatigue: Secondary | ICD-10-CM | POA: Diagnosis not present

## 2022-02-01 DIAGNOSIS — E538 Deficiency of other specified B group vitamins: Secondary | ICD-10-CM | POA: Insufficient documentation

## 2022-02-01 DIAGNOSIS — G4700 Insomnia, unspecified: Secondary | ICD-10-CM | POA: Insufficient documentation

## 2022-02-01 DIAGNOSIS — D649 Anemia, unspecified: Secondary | ICD-10-CM | POA: Insufficient documentation

## 2022-02-01 DIAGNOSIS — D72819 Decreased white blood cell count, unspecified: Secondary | ICD-10-CM | POA: Diagnosis not present

## 2022-02-01 DIAGNOSIS — Z8546 Personal history of malignant neoplasm of prostate: Secondary | ICD-10-CM | POA: Insufficient documentation

## 2022-02-01 MED FILL — Iron Sucrose Inj 20 MG/ML (Fe Equiv): INTRAVENOUS | Qty: 10 | Status: AC

## 2022-02-01 NOTE — Progress Notes (Signed)
[facility] NOTE  Patient Care Team: Gladstone Lighter, MD as PCP - General (Internal Medicine) Cammie Sickle, MD as Consulting Physician (Oncology)  CHIEF COMPLAINTS/PURPOSE OF CONSULTATION: ANEMIA   HEMATOLOGY HISTORY  # ANEMIA[ APRIL- 2023-;PCP; Hb12 platelets- WBC;-2.3 [ANC/Lymphopenia]; Iron sat-24%; ferritin-22 [Lower limit- >23];  GFR-80 CT/US- ;  EGD/colonoscopy-[early 2020; [place]]- MAY 2023- [facility] [mason Croley]; June 2023 iron saturation 30% ferritin 35; hemolysis labs negative; hepatitis B and C- July 2023-gentle iron  # PROSTATE CANCER [2018; "Gleason score=7"] s/p EBRT+ seed implants  HISTORY OF PRESENTING ILLNESS: Alone.  Ambulating independently. Shane [name] 71 y.o.  male pleasant patient with diagnosis of mild anemia is here for follow-up/review those of the blood work.  Patient currently continues to have fatigue.  Otherwise denies any blood in stools or black-colored stools.  Patient admits to chronic fatigue; progressively getting worse.  Currently taking B12 oral pills daily.  Otherwise denies any blood in stools or black-colored stools.   Review of Systems  Constitutional:  Positive for malaise/fatigue. Negative for chills, diaphoresis, fever and weight loss.  HENT:  Negative for nosebleeds and sore throat.   Eyes:  Negative for double vision.  Respiratory:  Negative for cough, hemoptysis, sputum production, shortness of breath and wheezing.   Cardiovascular:  Negative for chest pain, palpitations, orthopnea and leg swelling.  Gastrointestinal:  Positive for heartburn. Negative for abdominal pain, blood in stool, constipation, diarrhea, melena, nausea and vomiting.  Genitourinary:  Negative for dysuria, frequency and urgency.  Musculoskeletal:  Positive for joint pain. Negative for back pain.  Skin: Negative.  Negative for itching and rash.  Neurological:  Negative for dizziness, tingling, focal weakness, weakness and headaches.   Endo/Heme/Allergies:  Does not bruise/bleed easily.  Psychiatric/Behavioral:  Negative for depression. The patient is not nervous/anxious and does not have insomnia.      MEDICAL HISTORY:  History reviewed. No pertinent past medical history.  SURGICAL HISTORY: History reviewed. No pertinent surgical history.  SOCIAL HISTORY: Social History   Socioeconomic History   Marital status: Married    Spouse name: Not on file   Number of children: Not on file   Years of education: Not on file   Highest education level: Not on file  Occupational History   Not on file  Tobacco Use   Smoking status: Never   Smokeless tobacco: Never  Substance and Sexual Activity   Alcohol use: Not Currently   Drug use: Never   Sexual activity: Yes    Birth control/protection: None  Other Topics Concern   Not on file  Social History Narrative      From [place]; worked for [employer]; niece in [place]; 3 children. Never smoked; No alcohol.    Social Determinants of Health   Financial Resource Strain: Not on file  Food Insecurity: Not on file  Transportation Needs: Not on file  Physical Activity: Not on file  Stress: Not on file  Social Connections: Not on file  Intimate Partner Violence: Not on file    FAMILY HISTORY: Family History  Problem Relation Age of Onset   Diabetes Mother     ALLERGIES:  is allergic to milk protein, peanut-containing drug products, and other.  MEDICATIONS:  Current Outpatient Medications  Medication Sig Dispense Refill   celecoxib (CELEBREX) 200 MG capsule Take by mouth.     omeprazole (PRILOSEC) 40 MG capsule Take by mouth.     tamsulosin (FLOMAX) 0.4 MG CAPS capsule Take 1 capsule (0.4 mg total) by  mouth daily. 90 capsule 3   acetaminophen (TYLENOL) 500 MG tablet Take by mouth as needed.      tadalafil (CIALIS) 5 MG tablet Take 1-2 tablets by mouth as need 1hr prior to intercourse 30 tablet 11   No current facility-administered medications for this visit.      Marland Kitchen  PHYSICAL EXAMINATION:   Vitals:   02/01/22 1400  BP: (!) 93/57  Pulse: 63  Resp: 18  Temp: 97.9 F (36.6 C)   Filed Weights   02/01/22 1400  Weight: 146 lb 11.2 oz (66.5 kg)    Physical Exam Vitals and nursing note reviewed.  HENT:     Head: Normocephalic and atraumatic.     Mouth/Throat:     Pharynx: Oropharynx is clear.  Eyes:     Extraocular Movements: Extraocular movements intact.     Pupils: Pupils are equal, round, and reactive to light.  Cardiovascular:     Rate and Rhythm: Normal rate and regular rhythm.  Pulmonary:     Comments: Decreased breath sounds bilaterally.  Abdominal:     Palpations: Abdomen is soft.  Musculoskeletal:        General: Normal range of motion.     Cervical back: Normal range of motion.  Skin:    General: Skin is warm.  Neurological:     General: No focal deficit present.     Mental Status: He is alert and oriented to person, place, and time.  Psychiatric:        Behavior: Behavior normal.        Judgment: Judgment normal.      LABORATORY DATA:  I have reviewed the data as listed Lab Results  Component Value Date   WBC 3.5 (L) 01/19/2022   HGB 12.0 (L) 01/19/2022   HCT 36.0 (L) 01/19/2022   MCV 92.8 01/19/2022   PLT 179 01/19/2022   No results for input(s): "NA", "K", "CL", "CO2", "GLUCOSE", "BUN", "CREATININE", "CALCIUM", "GFRNONAA", "GFRAA", "PROT", "ALBUMIN", "AST", "ALT", "ALKPHOS", "BILITOT", "BILIDIR", "IBILI" in the last 8760 hours.   No results found.  ASSESSMENT & PLAN:   Symptomatic anemia #Mild anemia: June 2023- hemoglobin 12; ferritin 35 iron saturation 30; LDH normal no evidence of any hemolysis.  Hepatitis work-up negative.  No clinical concerns for any MDS or leukemias at this time [prior history of radiation to prostate 2018]; Would not recommend a bone marrow biopsy based on mild hematologic abnormalities at this time.  Recommend monitoring in 6 months if worse would recommend a bone marrow biopsy.   For now recommend gentle iron 1 pill a day.  #Elevated B12-elevated; recommend PO B12  q OD.   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  White count 3.5-normal differential. ?  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  # Fatigue: ?  Unclear Etiology. ? Insomnia [2 times/urination]. Limit caffeine. Defer to PCP re: Thyroid work up.   # Prostate cancer: s/p EBRT [2018]- followed Urologist, [place]; PSA 2023-  # DISPOSITION: # NO venofer today # follow up in late FEB 2024-  MD;labs- cbc/bmp;LDH; B12 levels; iron studies;ferritin--Dr.B    All questions were answered. The patient knows to call the clinic with any problems, questions or concerns.    Cammie Sickle, MD 02/01/2022 3:53 PM

## 2022-02-01 NOTE — Progress Notes (Signed)
Patient still feeling tired and would like to discuss labs drawn at last visit.    BP 93/57 HR 63 in office today and states that is low for him.

## 2022-02-01 NOTE — Progress Notes (Signed)
Per MD no Venofer today 

## 2022-02-01 NOTE — Patient Instructions (Signed)
#  Recommend gentle iron 1 pill a day; should not upset your stomach or cause constipation.  Talk to the pharmacist if you can find it/it is over-the-counter.  

## 2022-02-01 NOTE — Assessment & Plan Note (Addendum)
#  Mild anemia: June 2023- hemoglobin 12; ferritin 35 iron saturation 30; LDH normal no evidence of any hemolysis.  Hepatitis work-up negative.  No clinical concerns for any MDS or leukemias at this time [prior history of radiation to prostate 2018]; Would not recommend a bone marrow biopsy based on mild hematologic abnormalities at this time.  Recommend monitoring in 6 months if worse would recommend a bone marrow biopsy.  For now recommend gentle iron 1 pill a day.  #Elevated B12-elevated; recommend PO B12  q OD.   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  White count 3.5-normal differential. ?  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  # Fatigue: ?  Unclear Etiology. ? Insomnia [2 times/urination]. Limit caffeine. Defer to PCP re: Thyroid work up.   # Prostate cancer: s/p EBRT [2018]- followed Urologist, [place]; PSA 2023-  # DISPOSITION: # NO venofer today # follow up in late FEB 2024-  MD;labs- cbc/bmp;LDH; B12 levels; iron studies;ferritin--Dr.B

## 2022-06-15 ENCOUNTER — Other Ambulatory Visit (HOSPITAL_COMMUNITY): Payer: Self-pay | Admitting: Internal Medicine

## 2022-06-15 DIAGNOSIS — C61 Malignant neoplasm of prostate: Secondary | ICD-10-CM

## 2022-06-15 DIAGNOSIS — Z0000 Encounter for general adult medical examination without abnormal findings: Secondary | ICD-10-CM

## 2022-06-15 DIAGNOSIS — R634 Abnormal weight loss: Secondary | ICD-10-CM

## 2022-06-29 ENCOUNTER — Ambulatory Visit
Admission: RE | Admit: 2022-06-29 | Discharge: 2022-06-29 | Disposition: A | Discharge status: Home / Self Care | Payer: Medicare Other | Source: Ambulatory Visit | Attending: Internal Medicine | Admitting: Internal Medicine

## 2022-06-29 DIAGNOSIS — R634 Abnormal weight loss: Secondary | ICD-10-CM | POA: Insufficient documentation

## 2022-06-29 DIAGNOSIS — C61 Malignant neoplasm of prostate: Secondary | ICD-10-CM | POA: Insufficient documentation

## 2022-06-29 DIAGNOSIS — Z0000 Encounter for general adult medical examination without abnormal findings: Secondary | ICD-10-CM | POA: Diagnosis present

## 2022-08-04 ENCOUNTER — Other Ambulatory Visit: Payer: Self-pay | Admitting: Internal Medicine

## 2022-08-04 DIAGNOSIS — I2510 Atherosclerotic heart disease of native coronary artery without angina pectoris: Secondary | ICD-10-CM

## 2022-08-04 DIAGNOSIS — R634 Abnormal weight loss: Secondary | ICD-10-CM

## 2022-08-11 ENCOUNTER — Encounter: Payer: Self-pay | Admitting: Internal Medicine

## 2022-08-11 ENCOUNTER — Ambulatory Visit
Admission: RE | Admit: 2022-08-11 | Discharge: 2022-08-11 | Disposition: A | Discharge status: Home / Self Care | Payer: No Typology Code available for payment source | Source: Ambulatory Visit | Attending: Internal Medicine | Admitting: Internal Medicine

## 2022-08-11 DIAGNOSIS — R634 Abnormal weight loss: Secondary | ICD-10-CM

## 2022-08-11 DIAGNOSIS — I2510 Atherosclerotic heart disease of native coronary artery without angina pectoris: Secondary | ICD-10-CM

## 2022-09-15 ENCOUNTER — Inpatient Hospital Stay: Payer: Medicare Other | Attending: Internal Medicine

## 2022-09-15 DIAGNOSIS — C61 Malignant neoplasm of prostate: Secondary | ICD-10-CM | POA: Insufficient documentation

## 2022-09-15 DIAGNOSIS — R42 Dizziness and giddiness: Secondary | ICD-10-CM | POA: Diagnosis not present

## 2022-09-15 DIAGNOSIS — D649 Anemia, unspecified: Secondary | ICD-10-CM

## 2022-09-15 DIAGNOSIS — Z79899 Other long term (current) drug therapy: Secondary | ICD-10-CM | POA: Insufficient documentation

## 2022-09-15 LAB — CBC WITH DIFFERENTIAL/PLATELET
Abs Immature Granulocytes: 0.01 10*3/uL (ref 0.00–0.07)
Basophils Absolute: 0 10*3/uL (ref 0.0–0.1)
Basophils Relative: 0 %
Eosinophils Absolute: 0 10*3/uL (ref 0.0–0.5)
Eosinophils Relative: 1 %
HCT: 30.9 % — ABNORMAL LOW (ref 39.0–52.0)
Hemoglobin: 10.4 g/dL — ABNORMAL LOW (ref 13.0–17.0)
Immature Granulocytes: 0 %
Lymphocytes Relative: 24 %
Lymphs Abs: 0.7 10*3/uL (ref 0.7–4.0)
MCH: 31.1 pg (ref 26.0–34.0)
MCHC: 33.7 g/dL (ref 30.0–36.0)
MCV: 92.5 fL (ref 80.0–100.0)
Monocytes Absolute: 0.3 10*3/uL (ref 0.1–1.0)
Monocytes Relative: 9 %
Neutro Abs: 1.8 10*3/uL (ref 1.7–7.7)
Neutrophils Relative %: 66 %
Platelets: 149 10*3/uL — ABNORMAL LOW (ref 150–400)
RBC: 3.34 MIL/uL — ABNORMAL LOW (ref 4.22–5.81)
RDW: 13.5 % (ref 11.5–15.5)
WBC: 2.8 10*3/uL — ABNORMAL LOW (ref 4.0–10.5)
nRBC: 0 % (ref 0.0–0.2)

## 2022-09-15 LAB — IRON AND TIBC
Iron: 110 ug/dL (ref 45–182)
Saturation Ratios: 34 % (ref 17.9–39.5)
TIBC: 321 ug/dL (ref 250–450)
UIBC: 211 ug/dL

## 2022-09-15 LAB — BASIC METABOLIC PANEL
Anion gap: 9 (ref 5–15)
BUN: 20 mg/dL (ref 8–23)
CO2: 26 mmol/L (ref 22–32)
Calcium: 9.1 mg/dL (ref 8.9–10.3)
Chloride: 101 mmol/L (ref 98–111)
Creatinine, Ser: 1 mg/dL (ref 0.61–1.24)
GFR, Estimated: >60 mL/min (ref >60)
Glucose, Bld: 112 mg/dL — ABNORMAL HIGH (ref 70–99)
Potassium: 3.9 mmol/L (ref 3.5–5.1)
Sodium: 136 mmol/L (ref 135–145)

## 2022-09-15 LAB — LACTATE DEHYDROGENASE: LDH: 124 U/L (ref 98–192)

## 2022-09-15 LAB — FERRITIN: Ferritin: 17 ng/mL — ABNORMAL LOW (ref 24–336)

## 2022-09-15 LAB — VITAMIN B12: Vitamin B-12: 841 pg/mL (ref 180–914)

## 2022-09-20 ENCOUNTER — Other Ambulatory Visit: Payer: Medicare Other

## 2022-09-20 ENCOUNTER — Encounter: Payer: Self-pay | Admitting: Internal Medicine

## 2022-09-20 ENCOUNTER — Inpatient Hospital Stay (HOSPITAL_BASED_OUTPATIENT_CLINIC_OR_DEPARTMENT_OTHER): Payer: Medicare Other | Admitting: Internal Medicine

## 2022-09-20 VITALS — BP 107/64 | HR 69 | Resp 18 | Ht 75.0 in | Wt 143.0 lb

## 2022-09-20 DIAGNOSIS — Z125 Encounter for screening for malignant neoplasm of prostate: Secondary | ICD-10-CM | POA: Diagnosis not present

## 2022-09-20 DIAGNOSIS — Z139 Encounter for screening, unspecified: Secondary | ICD-10-CM | POA: Diagnosis not present

## 2022-09-20 DIAGNOSIS — D649 Anemia, unspecified: Secondary | ICD-10-CM | POA: Diagnosis not present

## 2022-09-20 DIAGNOSIS — C61 Malignant neoplasm of prostate: Secondary | ICD-10-CM | POA: Diagnosis not present

## 2022-09-20 DIAGNOSIS — M21619 Bunion of unspecified foot: Secondary | ICD-10-CM | POA: Insufficient documentation

## 2022-09-20 NOTE — Progress Notes (Signed)
[facility] NOTE  Patient Care Team: Gladstone Lighter, MD as PCP - General (Internal Medicine) Cammie Sickle, MD as Consulting Physician (Oncology)  CHIEF COMPLAINTS/PURPOSE OF CONSULTATION: ANEMIA   HEMATOLOGY HISTORY  # ANEMIA[ APRIL- 2023-;PCP; Hb12 platelets- WBC;-2.3 [ANC/Lymphopenia]; Iron sat-24%; ferritin-22 [Lower limit- >23];  GFR-80 CT/US- ;  EGD/colonoscopy-[early 2020; [place]]- MAY 2023- [facility] [mason Croley]; June 2023 iron saturation 30% ferritin 35; hemolysis labs negative; hepatitis B and C- July 2023-gentle iron  # PROSTATE CANCER [2018; "Gleason score=7"] s/p EBRT+ seed implants  HISTORY OF PRESENTING ILLNESS: Alone.  Ambulating independently.  Shane [name] 72 y.o.  male pleasant patient with diagnosis of mild anemia is here for follow-up/review those of the blood work  Patient states that he has been feeling a little dizzy at times and he checks his BP at home and the bottom number is in the 60's. He says when its in the 70's, he feels better.  Patient currently continues to have fatigue.  Otherwise denies any blood in stools or black-colored stools. Currently on PO iron.     Review of Systems  Constitutional:  Positive for malaise/fatigue. Negative for chills, diaphoresis, fever and weight loss.  HENT:  Negative for nosebleeds and sore throat.   Eyes:  Negative for double vision.  Respiratory:  Negative for cough, hemoptysis, sputum production, shortness of breath and wheezing.   Cardiovascular:  Negative for chest pain, palpitations, orthopnea and leg swelling.  Gastrointestinal:  Positive for heartburn. Negative for abdominal pain, blood in stool, constipation, diarrhea, melena, nausea and vomiting.  Genitourinary:  Negative for dysuria, frequency and urgency.  Musculoskeletal:  Positive for joint pain. Negative for back pain.  Skin: Negative.  Negative for itching and rash.  Neurological:  Negative for dizziness, tingling,  focal weakness, weakness and headaches.  Endo/Heme/Allergies:  Does not bruise/bleed easily.  Psychiatric/Behavioral:  Negative for depression. The patient is not nervous/anxious and does not have insomnia.      MEDICAL HISTORY:  History reviewed. No pertinent past medical history.  SURGICAL HISTORY: History reviewed. No pertinent surgical history.  SOCIAL HISTORY: Social History   Socioeconomic History   Marital status: Married    Spouse name: Not on file   Number of children: Not on file   Years of education: Not on file   Highest education level: Not on file  Occupational History   Not on file  Tobacco Use   Smoking status: Never   Smokeless tobacco: Never  Substance and Sexual Activity   Alcohol use: Not Currently   Drug use: Never   Sexual activity: Yes    Birth control/protection: None  Other Topics Concern   Not on file  Social History Narrative      From [place]; worked for [employer]; niece in [place]; 3 children. Never smoked; No alcohol.    Social Determinants of Health   Financial Resource Strain: Not on file  Food Insecurity: Not on file  Transportation Needs: Not on file  Physical Activity: Not on file  Stress: Not on file  Social Connections: Not on file  Intimate Partner Violence: Not on file    FAMILY HISTORY: Family History  Problem Relation Age of Onset   Diabetes Mother     ALLERGIES:  is allergic to milk protein, peanut-containing drug products, and other.  MEDICATIONS:  Current Outpatient Medications  Medication Sig Dispense Refill   celecoxib (CELEBREX) 200 MG capsule Take by mouth.     omeprazole (PRILOSEC) 40 MG capsule Take  by mouth.     tadalafil (CIALIS) 5 MG tablet Take 1-2 tablets by mouth as need 1hr prior to intercourse 30 tablet 11   tamsulosin (FLOMAX) 0.4 MG CAPS capsule Take 1 capsule (0.4 mg total) by mouth daily. 90 capsule 3   No current facility-administered medications for this visit.     Marland Kitchen  PHYSICAL  EXAMINATION:   Vitals:   09/20/22 1443  BP: 107/64  Pulse: 69  Resp: 18  SpO2: 100%   Filed Weights   09/20/22 1438  Weight: 143 lb (64.9 kg)    Physical Exam Vitals and nursing note reviewed.  HENT:     Head: Normocephalic and atraumatic.     Mouth/Throat:     Pharynx: Oropharynx is clear.  Eyes:     Extraocular Movements: Extraocular movements intact.     Pupils: Pupils are equal, round, and reactive to light.  Cardiovascular:     Rate and Rhythm: Normal rate and regular rhythm.  Pulmonary:     Comments: Decreased breath sounds bilaterally.  Abdominal:     Palpations: Abdomen is soft.  Musculoskeletal:        General: Normal range of motion.     Cervical back: Normal range of motion.  Skin:    General: Skin is warm.  Neurological:     General: No focal deficit present.     Mental Status: He is alert and oriented to person, place, and time.  Psychiatric:        Behavior: Behavior normal.        Judgment: Judgment normal.      LABORATORY DATA:  I have reviewed the data as listed Lab Results  Component Value Date   WBC 2.8 (L) 09/15/2022   HGB 10.4 (L) 09/15/2022   HCT 30.9 (L) 09/15/2022   MCV 92.5 09/15/2022   PLT 149 (L) 09/15/2022   Recent Labs    09/15/22 1306  NA 136  K 3.9  CL 101  CO2 26  GLUCOSE 112*  BUN 20  CREATININE 1.00  CALCIUM 9.1  GFRNONAA >60     No results found.  ASSESSMENT & PLAN:   Symptomatic anemia #Mild anemia: June 2023- hemoglobin 12; ferritin 35 iron saturation 30; LDH normal no evidence of any hemolysis.  Hepatitis work-up negative.  Patient currently on oral iron gentle once a day.  # February 2024- iron saturation 35 ferritin 17- LOW; hemoglobin 10; white count 2.3 ANC 1.8; platelets 149.  Etiology of worsening pancytopenia is unclear.  Given the iron deficiency/lack of response on p.o. iron- plan on infusion weekly x 3. Discussed the potential acute infusion reactions with IV iron; which are quite rare.   Patient understands the risk; will proceed with infusions.  # Etiology of anemia/pancytopenia unclear : CT scan-July 2023 negative for cirrhosis /splenomegaly negative.  Discussed options including proceeding with a bone marrow biopsy/iron infusions.  For now proceed with iron infusion.  If not improved will consider bone marrow biopsy.  Send a message to [facility] re: EGD/colonoscopy.  # Elevated B12-elevated; recommend PO B12  q OD.   # Prostate cancer: s/p EBRT [2018]- followed Urologist, [place] [[facility]]; PSA 2023-FEB- WNL; will repeat PSA in 1 month.  # Dizzy spells-borderline blood pressures.  No blood pressure medications except for Flomax again chronic more than 2 years.  Unclear etiology.  Recommend compression stockings bilateral.   # DISPOSITION: # venofer weekly x 3; start this week.  # follow up 1 months-MD: labs- cbc/bmp; PSA; possible  venofer-Dr.B    All questions were answered. The patient knows to call the clinic with any problems, questions or concerns.    Cammie Sickle, MD 09/20/2022 3:09 PM

## 2022-09-20 NOTE — Assessment & Plan Note (Signed)
#  Mild anemia: June 2023- hemoglobin 12; ferritin 35 iron saturation 30; LDH normal no evidence of any hemolysis.  Hepatitis work-up negative.  Patient currently on oral iron gentle once a day.  # February 2024- iron saturation 35 ferritin 17- LOW; hemoglobin 10; white count 2.3 ANC 1.8; platelets 149.  Etiology of worsening pancytopenia is unclear.  Given the iron deficiency/lack of response on p.o. iron- plan on infusion weekly x 3. Discussed the potential acute infusion reactions with IV iron; which are quite rare.  Patient understands the risk; will proceed with infusions.  # Etiology of anemia/pancytopenia unclear : CT scan-July 2023 negative for cirrhosis /splenomegaly negative.  Discussed options including proceeding with a bone marrow biopsy/iron infusions.  For now proceed with iron infusion.  If not improved will consider bone marrow biopsy.  Send a message to [employer] re: EGD/colonoscopy.  # Elevated B12-elevated; recommend PO B12  q OD.   # Prostate cancer: s/p EBRT [2018]- followed Urologist, [place] [[facility]]; PSA 2023-FEB- WNL; will repeat PSA in 1 month.  # Dizzy spells-borderline blood pressures.  No blood pressure medications except for Flomax again chronic more than 2 years.  Unclear etiology.  Recommend compression stockings bilateral.   # DISPOSITION: # venofer weekly x 3; start this week.  # follow up 1 months-MD: labs- cbc/bmp; PSA; possible venofer-Dr.B

## 2022-09-20 NOTE — Progress Notes (Signed)
Patient states that he has been feeling a little dizzy at times and he checks his BP at home and the bottom number is in the 60's. He says when its in the 70's, he feels better.

## 2022-09-24 ENCOUNTER — Inpatient Hospital Stay: Payer: Medicare Other

## 2022-09-24 VITALS — BP 123/71 | HR 55 | Temp 96.0°F | Resp 16

## 2022-09-24 DIAGNOSIS — C61 Malignant neoplasm of prostate: Secondary | ICD-10-CM | POA: Diagnosis not present

## 2022-09-24 DIAGNOSIS — D649 Anemia, unspecified: Secondary | ICD-10-CM

## 2022-09-24 MED ORDER — SODIUM CHLORIDE 0.9 % IV SOLN
Freq: Once | INTRAVENOUS | Status: AC
  Filled 2022-09-24: qty 250

## 2022-09-24 MED ORDER — SODIUM CHLORIDE 0.9 % IV SOLN
200.0000 mg | Freq: Once | INTRAVENOUS | Status: AC
  Administered 2022-09-24: 200 mg via INTRAVENOUS
  Filled 2022-09-24: qty 200

## 2022-09-25 ENCOUNTER — Encounter: Payer: Self-pay | Admitting: Internal Medicine

## 2022-10-01 ENCOUNTER — Inpatient Hospital Stay: Payer: Medicare Other | Attending: Internal Medicine

## 2022-10-01 VITALS — BP 123/70 | HR 64 | Temp 96.5°F | Resp 16

## 2022-10-01 DIAGNOSIS — C61 Malignant neoplasm of prostate: Secondary | ICD-10-CM | POA: Diagnosis not present

## 2022-10-01 DIAGNOSIS — Z79899 Other long term (current) drug therapy: Secondary | ICD-10-CM | POA: Insufficient documentation

## 2022-10-01 DIAGNOSIS — D509 Iron deficiency anemia, unspecified: Secondary | ICD-10-CM | POA: Diagnosis not present

## 2022-10-01 DIAGNOSIS — R42 Dizziness and giddiness: Secondary | ICD-10-CM | POA: Insufficient documentation

## 2022-10-01 DIAGNOSIS — D649 Anemia, unspecified: Secondary | ICD-10-CM

## 2022-10-01 MED ORDER — SODIUM CHLORIDE 0.9 % IV SOLN
Freq: Once | INTRAVENOUS | Status: AC
  Filled 2022-10-01: qty 250

## 2022-10-01 MED ORDER — SODIUM CHLORIDE 0.9 % IV SOLN
200.0000 mg | Freq: Once | INTRAVENOUS | Status: AC
  Administered 2022-10-01: 200 mg via INTRAVENOUS
  Filled 2022-10-01: qty 200

## 2022-10-08 ENCOUNTER — Inpatient Hospital Stay: Payer: Medicare Other

## 2022-10-08 DIAGNOSIS — D649 Anemia, unspecified: Secondary | ICD-10-CM

## 2022-10-08 DIAGNOSIS — C61 Malignant neoplasm of prostate: Secondary | ICD-10-CM | POA: Diagnosis not present

## 2022-10-08 MED ORDER — SODIUM CHLORIDE 0.9 % IV SOLN
Freq: Once | INTRAVENOUS | Status: AC
  Filled 2022-10-08: qty 250

## 2022-10-08 MED ORDER — SODIUM CHLORIDE 0.9 % IV SOLN
200.0000 mg | Freq: Once | INTRAVENOUS | Status: AC
  Administered 2022-10-08: 200 mg via INTRAVENOUS
  Filled 2022-10-08: qty 200

## 2022-10-12 ENCOUNTER — Encounter: Payer: Self-pay | Admitting: Internal Medicine

## 2022-10-18 MED FILL — Iron Sucrose Inj 20 MG/ML (Fe Equiv): INTRAVENOUS | Qty: 10 | Status: AC

## 2022-10-19 ENCOUNTER — Encounter: Payer: Self-pay | Admitting: Internal Medicine

## 2022-10-19 ENCOUNTER — Inpatient Hospital Stay (HOSPITAL_BASED_OUTPATIENT_CLINIC_OR_DEPARTMENT_OTHER): Payer: Medicare Other | Admitting: Internal Medicine

## 2022-10-19 ENCOUNTER — Inpatient Hospital Stay: Payer: Medicare Other

## 2022-10-19 VITALS — BP 112/67 | HR 67 | Temp 97.4°F | Resp 16 | Wt 140.2 lb

## 2022-10-19 DIAGNOSIS — C61 Malignant neoplasm of prostate: Secondary | ICD-10-CM | POA: Diagnosis not present

## 2022-10-19 DIAGNOSIS — D649 Anemia, unspecified: Secondary | ICD-10-CM | POA: Diagnosis not present

## 2022-10-19 DIAGNOSIS — Z139 Encounter for screening, unspecified: Secondary | ICD-10-CM

## 2022-10-19 LAB — CBC WITH DIFFERENTIAL (CANCER CENTER ONLY)
Abs Immature Granulocytes: 0 10*3/uL (ref 0.00–0.07)
Basophils Absolute: 0 10*3/uL (ref 0.0–0.1)
Basophils Relative: 0 %
Eosinophils Absolute: 0 10*3/uL (ref 0.0–0.5)
Eosinophils Relative: 1 %
HCT: 33.3 % — ABNORMAL LOW (ref 39.0–52.0)
Hemoglobin: 11 g/dL — ABNORMAL LOW (ref 13.0–17.0)
Immature Granulocytes: 0 %
Lymphocytes Relative: 30 %
Lymphs Abs: 0.9 10*3/uL (ref 0.7–4.0)
MCH: 30.9 pg (ref 26.0–34.0)
MCHC: 33 g/dL (ref 30.0–36.0)
MCV: 93.5 fL (ref 80.0–100.0)
Monocytes Absolute: 0.3 10*3/uL (ref 0.1–1.0)
Monocytes Relative: 8 %
Neutro Abs: 1.8 10*3/uL (ref 1.7–7.7)
Neutrophils Relative %: 61 %
Platelet Count: 167 10*3/uL (ref 150–400)
RBC: 3.56 MIL/uL — ABNORMAL LOW (ref 4.22–5.81)
RDW: 13.4 % (ref 11.5–15.5)
WBC Count: 3 10*3/uL — ABNORMAL LOW (ref 4.0–10.5)
nRBC: 0 % (ref 0.0–0.2)

## 2022-10-19 LAB — BASIC METABOLIC PANEL - CANCER CENTER ONLY
Anion gap: 6 (ref 5–15)
BUN: 25 mg/dL — ABNORMAL HIGH (ref 8–23)
CO2: 26 mmol/L (ref 22–32)
Calcium: 9.3 mg/dL (ref 8.9–10.3)
Chloride: 103 mmol/L (ref 98–111)
Creatinine: 0.98 mg/dL (ref 0.61–1.24)
GFR, Estimated: >60 mL/min (ref >60)
Glucose, Bld: 123 mg/dL — ABNORMAL HIGH (ref 70–99)
Potassium: 4.2 mmol/L (ref 3.5–5.1)
Sodium: 135 mmol/L (ref 135–145)

## 2022-10-19 LAB — PSA: Prostatic Specific Antigen: <0.01 ng/mL (ref 0.00–4.00)

## 2022-10-19 NOTE — Assessment & Plan Note (Addendum)
#  Mild anemia: June 2023- hemoglobin 12; ferritin 35 iron saturation 30; LDH normal no evidence of any hemolysis.  Hepatitis work-up negative.  Patient currently on oral iron gentle once a day.  # February 2024- iron saturation 35 ferritin 17- LOW; hemoglobin 11; white count 2.3 ANC 1.8; platelets 149. S/p venofer- improving; contine gentle iron.   # Etiology of anemia/pancytopenia unclear : CT scan-July 2023 negative for cirrhosis /splenomegaly negative. IHOLD off bone marrow.  Awaiting Octavia Bruckner re: EGD/colonoscopy in July 2024.  # Elevated B12-elevated; recommend PO B12  q OD.   # Prostate cancer: s/p EBRT [2018]- followed Urologist, [place] [[facility]]; PSA 2023-FEB- WNL; will repeat PSA today. Stable.   # Dizzy spells-borderline blood pressures- ? Flomax- stable.   # DISPOSITION: # HOLD venofer today.  # follow up 4 months-MD: labs- cbc/bmp; iron labs; ferritin; possible venofer-Dr.B

## 2022-10-19 NOTE — Progress Notes (Signed)
Shane [name]  Patient Care Team: Shane Lighter, [name] as PCP - General (Internal Medicine) Shane [name] as Consulting Physician (Oncology)  CHIEF COMPLAINTS/PURPOSE OF CONSULTATION: ANEMIA  HEMATOLOGY HISTORY  # ANEMIA[ APRIL- 2023-;PCP; Hb12 platelets- WBC;-2.3 [ANC/Lymphopenia]; Iron sat-24%; ferritin-22 [Lower limit- >23];  GFR-80 CT/US- ;  EGD/colonoscopy-[early 2020; [place]]- MAY 2023- [facility] [Shane [name]]; June 2023 iron saturation 30% ferritin 35; hemolysis labs negative; hepatitis B and C- July 2023-gentle iron  # PROSTATE CANCER [2018; "Gleason score=7"] s/p EBRT+ seed implants  HISTORY OF PRESENTING ILLNESS: Alone.  Ambulating independently.  Shane [name] 72 y.o.  male pleasant patient with diagnosis of mild anemia is here for follow-up/review those of the blood work.  In the interim patient received IV iron infusion energy levels improved.    However, Patient tires easily, has some dizziness.  Overall improved since last visit.  Denies any dyspnea. Appetite is good. No visible blood anywhere.   Patient denies any significant constipation. Instructed him to go up on the capfuls from 1 to 2 once or twice a day.  Review of Systems  Constitutional:  Positive for malaise/fatigue. Negative for chills, diaphoresis, fever and weight loss.  HENT:  Negative for nosebleeds and sore throat.   Eyes:  Negative for double vision.  Respiratory:  Negative for cough, hemoptysis, sputum production, shortness of breath and wheezing.   Cardiovascular:  Negative for chest pain, palpitations, orthopnea and leg swelling.  Gastrointestinal:  Positive for heartburn. Negative for abdominal pain, blood in stool, constipation, diarrhea, melena, nausea and vomiting.  Genitourinary:  Negative for dysuria, frequency and urgency.  Musculoskeletal:  Positive for joint pain. Negative for back pain.  Skin: Negative.  Negative for itching and rash.  Neurological:   Negative for dizziness, tingling, focal weakness, weakness and headaches.  Endo/Heme/Allergies:  Does not bruise/bleed easily.  Psychiatric/Behavioral:  Negative for depression. The patient is not nervous/anxious and does not have insomnia.      MEDICAL HISTORY:  History reviewed. No pertinent past medical history.  SURGICAL HISTORY: History reviewed. No pertinent surgical history.  SOCIAL HISTORY: Social History   Socioeconomic History   Marital status: Married    Spouse name: Not on file   Number of children: Not on file   Years of education: Not on file   Highest education level: Not on file  Occupational History   Not on file  Tobacco Use   Smoking status: Never   Smokeless tobacco: Never  Substance and Sexual Activity   Alcohol use: Not Currently   Drug use: Never   Sexual activity: Yes    Birth control/protection: None  Other Topics Concern   Not on file  Social History Narrative      From [place]; worked for [employer]; niece in [place]; 3 children. Never smoked; No alcohol.    Social Determinants of Health   Financial Resource Strain: Not on file  Food Insecurity: Not on file  Transportation Needs: Not on file  Physical Activity: Not on file  Stress: Not on file  Social Connections: Not on file  Intimate Partner Violence: Not on file    FAMILY HISTORY: Family History  Problem Relation Age of Onset   Diabetes Mother     ALLERGIES:  is allergic to milk protein, peanut-containing drug products, and other.  MEDICATIONS:  Current Outpatient Medications  Medication Sig Dispense Refill   celecoxib (CELEBREX) 200 MG capsule Take by mouth.     omeprazole (PRILOSEC) 40 MG capsule Take  by mouth.     tamsulosin (FLOMAX) 0.4 MG CAPS capsule Take 1 capsule (0.4 mg total) by mouth daily. 90 capsule 3   No current facility-administered medications for this visit.     Marland Kitchen  PHYSICAL EXAMINATION:   Vitals:   10/19/22 1329  BP: 112/67  Pulse: 67  Resp: 16   Temp: (!) 97.4 F (36.3 C)  SpO2: 100%   Filed Weights   10/19/22 1329  Weight: 140 lb 3.2 oz (63.6 kg)    Physical Exam Vitals and nursing [name] reviewed.  HENT:     Head: Normocephalic and atraumatic.     Mouth/Throat:     Pharynx: Oropharynx is clear.  Eyes:     Extraocular Movements: Extraocular movements intact.     Pupils: Pupils are equal, round, and reactive to light.  Cardiovascular:     Rate and Rhythm: Normal rate and regular rhythm.  Pulmonary:     Comments: Decreased breath sounds bilaterally.  Abdominal:     Palpations: Abdomen is soft.  Musculoskeletal:        General: Normal range of motion.     Cervical back: Normal range of motion.  Skin:    General: Skin is warm.  Neurological:     General: No focal deficit present.     Mental Status: He is alert and oriented to person, place, and time.  Psychiatric:        Behavior: Behavior normal.        Judgment: Judgment normal.      LABORATORY DATA:  I have reviewed the data as listed Lab Results  Component Value Date   WBC 3.0 (L) 10/19/2022   HGB 11.0 (L) 10/19/2022   HCT 33.3 (L) 10/19/2022   MCV 93.5 10/19/2022   PLT 167 10/19/2022   Recent Labs    09/15/22 1306 10/19/22 1320  NA 136 135  K 3.9 4.2  CL 101 103  CO2 26 26  GLUCOSE 112* 123*  BUN 20 25*  CREATININE 1.00 0.98  CALCIUM 9.1 9.3  GFRNONAA >60 >60     No results found.  ASSESSMENT & PLAN:   Symptomatic anemia #Mild anemia: June 2023- hemoglobin 12; ferritin 35 iron saturation 30; LDH normal no evidence of any hemolysis.  Hepatitis work-up negative.  Patient currently on oral iron gentle once a day.  # February 2024- iron saturation 35 ferritin 17- LOW; hemoglobin 11; white count 2.3 ANC 1.8; platelets 149. S/p venofer- improving; contine gentle iron.   # Etiology of anemia/pancytopenia unclear : CT scan-July 2023 negative for cirrhosis /splenomegaly negative. IHOLD off bone marrow.  Awaiting Shane [name] re:  EGD/colonoscopy in July 2024.  # Elevated B12-elevated; recommend PO B12  q OD.   # Prostate cancer: s/p EBRT [2018]- followed Urologist, Shane [name] [[facility]]; PSA 2023-FEB- WNL; will repeat PSA today. Stable.   # Dizzy spells-borderline blood pressures- ? Flomax- stable.   # DISPOSITION: # HOLD venofer today.  # follow up 4 months-[name]: labs- cbc/bmp; iron labs; ferritin; possible venofer-Dr.B   All questions were answered. The patient knows to call the clinic with any problems, questions or concerns.    Shane [name] 10/19/2022 2:41 PM

## 2022-10-19 NOTE — Progress Notes (Signed)
Patient tires easily, has some dizziness . Denies any dyspnea. Appetite is good. No visible blood anywhere.Has constipation. Instructed him to go up on the capfuls from 1 to 2 once or twice a day.

## 2023-02-17 MED FILL — Iron Sucrose Inj 20 MG/ML (Fe Equiv): INTRAVENOUS | Qty: 10 | Status: AC

## 2023-02-18 ENCOUNTER — Inpatient Hospital Stay: Payer: Medicare Other | Admitting: Internal Medicine

## 2023-02-18 ENCOUNTER — Inpatient Hospital Stay: Payer: Medicare Other

## 2023-03-07 ENCOUNTER — Ambulatory Visit: Payer: Medicare Other

## 2023-03-07 DIAGNOSIS — K5909 Other constipation: Secondary | ICD-10-CM | POA: Diagnosis not present

## 2023-03-07 DIAGNOSIS — D509 Iron deficiency anemia, unspecified: Secondary | ICD-10-CM | POA: Diagnosis not present

## 2023-03-07 DIAGNOSIS — K5730 Diverticulosis of large intestine without perforation or abscess without bleeding: Secondary | ICD-10-CM | POA: Diagnosis not present

## 2023-03-07 DIAGNOSIS — K640 First degree hemorrhoids: Secondary | ICD-10-CM | POA: Diagnosis not present

## 2023-03-07 DIAGNOSIS — K449 Diaphragmatic hernia without obstruction or gangrene: Secondary | ICD-10-CM | POA: Diagnosis not present

## 2023-03-07 DIAGNOSIS — K3189 Other diseases of stomach and duodenum: Secondary | ICD-10-CM | POA: Diagnosis not present

## 2023-03-07 DIAGNOSIS — K2950 Unspecified chronic gastritis without bleeding: Secondary | ICD-10-CM | POA: Diagnosis not present

## 2023-03-07 DIAGNOSIS — D649 Anemia, unspecified: Secondary | ICD-10-CM | POA: Diagnosis not present

## 2023-03-10 MED FILL — Iron Sucrose Inj 20 MG/ML (Fe Equiv): INTRAVENOUS | Qty: 10 | Status: AC

## 2023-03-11 ENCOUNTER — Inpatient Hospital Stay: Payer: Medicare Other

## 2023-03-11 ENCOUNTER — Inpatient Hospital Stay: Payer: Medicare Other | Admitting: Internal Medicine

## 2023-03-11 ENCOUNTER — Inpatient Hospital Stay: Payer: Medicare Other | Attending: Internal Medicine

## 2023-03-11 ENCOUNTER — Other Ambulatory Visit: Payer: Self-pay

## 2023-03-11 ENCOUNTER — Encounter: Payer: Self-pay | Admitting: Internal Medicine

## 2023-03-11 VITALS — BP 113/70 | HR 63 | Temp 97.5°F | Resp 16 | Wt 143.6 lb

## 2023-03-11 DIAGNOSIS — D649 Anemia, unspecified: Secondary | ICD-10-CM | POA: Insufficient documentation

## 2023-03-11 DIAGNOSIS — R5383 Other fatigue: Secondary | ICD-10-CM

## 2023-03-11 DIAGNOSIS — K449 Diaphragmatic hernia without obstruction or gangrene: Secondary | ICD-10-CM | POA: Insufficient documentation

## 2023-03-11 DIAGNOSIS — K5909 Other constipation: Secondary | ICD-10-CM | POA: Diagnosis not present

## 2023-03-11 DIAGNOSIS — Z9889 Other specified postprocedural states: Secondary | ICD-10-CM | POA: Insufficient documentation

## 2023-03-11 DIAGNOSIS — Z79899 Other long term (current) drug therapy: Secondary | ICD-10-CM | POA: Insufficient documentation

## 2023-03-11 DIAGNOSIS — C61 Malignant neoplasm of prostate: Secondary | ICD-10-CM | POA: Diagnosis not present

## 2023-03-11 LAB — CMP (CANCER CENTER ONLY)
ALT: 23 U/L (ref 0–44)
AST: 31 U/L (ref 15–41)
Albumin: 4.5 g/dL (ref 3.5–5.0)
Alkaline Phosphatase: 37 U/L — ABNORMAL LOW (ref 38–126)
Anion gap: 8 (ref 5–15)
BUN: 19 mg/dL (ref 8–23)
CO2: 25 mmol/L (ref 22–32)
Calcium: 9.2 mg/dL (ref 8.9–10.3)
Chloride: 102 mmol/L (ref 98–111)
Creatinine: 1.04 mg/dL (ref 0.61–1.24)
GFR, Estimated: >60 mL/min (ref >60)
Glucose, Bld: 109 mg/dL — ABNORMAL HIGH (ref 70–99)
Potassium: 4.1 mmol/L (ref 3.5–5.1)
Sodium: 135 mmol/L (ref 135–145)
Total Bilirubin: 0.7 mg/dL (ref 0.3–1.2)
Total Protein: 7.1 g/dL (ref 6.5–8.1)

## 2023-03-11 LAB — CBC WITH DIFFERENTIAL (CANCER CENTER ONLY)
Abs Immature Granulocytes: 0 10*3/uL (ref 0.00–0.07)
Basophils Absolute: 0 10*3/uL (ref 0.0–0.1)
Basophils Relative: 1 %
Eosinophils Absolute: 0 10*3/uL (ref 0.0–0.5)
Eosinophils Relative: 2 %
HCT: 33.4 % — ABNORMAL LOW (ref 39.0–52.0)
Hemoglobin: 11 g/dL — ABNORMAL LOW (ref 13.0–17.0)
Immature Granulocytes: 0 %
Lymphocytes Relative: 30 %
Lymphs Abs: 0.7 10*3/uL (ref 0.7–4.0)
MCH: 31.1 pg (ref 26.0–34.0)
MCHC: 32.9 g/dL (ref 30.0–36.0)
MCV: 94.4 fL (ref 80.0–100.0)
Monocytes Absolute: 0.2 10*3/uL (ref 0.1–1.0)
Monocytes Relative: 10 %
Neutro Abs: 1.3 10*3/uL — ABNORMAL LOW (ref 1.7–7.7)
Neutrophils Relative %: 57 %
Platelet Count: 160 10*3/uL (ref 150–400)
RBC: 3.54 MIL/uL — ABNORMAL LOW (ref 4.22–5.81)
RDW: 13.2 % (ref 11.5–15.5)
WBC Count: 2.3 10*3/uL — ABNORMAL LOW (ref 4.0–10.5)
nRBC: 0 % (ref 0.0–0.2)

## 2023-03-11 LAB — IRON AND TIBC
Iron: 74 ug/dL (ref 45–182)
Saturation Ratios: 25 % (ref 17.9–39.5)
TIBC: 302 ug/dL (ref 250–450)
UIBC: 228 ug/dL

## 2023-03-11 LAB — TSH: TSH: 0.944 u[IU]/mL (ref 0.350–4.500)

## 2023-03-11 LAB — FERRITIN: Ferritin: 88 ng/mL (ref 24–336)

## 2023-03-11 NOTE — Progress Notes (Signed)
[facility] CONSULT NOTE  Patient Care Team: Enid Baas, MD as PCP - General (Internal Medicine) Earna Coder, MD as Consulting Physician (Oncology)  CHIEF COMPLAINTS/PURPOSE OF CONSULTATION: ANEMIA  HEMATOLOGY HISTORY  # ANEMIA[ APRIL- 2023-;PCP; Hb12 platelets- WBC;-2.3 [ANC/Lymphopenia]; Iron sat-24%; ferritin-22 [Lower limit- >23];  GFR-80 CT/US- ;  EGD/colonoscopy-[early 2020; [place]]- MAY 2023- [facility] [[facility]]; June 2023 iron saturation 30% ferritin 35; hemolysis labs negative; hepatitis B and C- July 2023-gentle iron; Hx of bone marrow [> 20 years - abnormal blood counts]  # PROSTATE CANCER [2018; "Gleason score=7"] s/p EBRT+ seed implants  HISTORY OF PRESENTING ILLNESS: Alone.  Ambulating independently.  Shane [name] 72 y.o.  male pleasant patient with diagnosis of mild anemia is here for follow-up/review those of the blood work.  Patient s/p colonoscopy and EGD earlier in the week.   Feels very tired all the time. Zero energy. Gets lightheaded if he gets up too quickly. No blood visible in stools. Chronic constipation, taking miralax. Eats 3 healthy meals per day   Denies any dyspnea. Appetite is good. No visible blood anywhere. No weight loss.   Review of Systems  Constitutional:  Positive for malaise/fatigue. Negative for chills, diaphoresis, fever and weight loss.  HENT:  Negative for nosebleeds and sore throat.   Eyes:  Negative for double vision.  Respiratory:  Negative for cough, hemoptysis, sputum production, shortness of breath and wheezing.   Cardiovascular:  Negative for chest pain, palpitations, orthopnea and leg swelling.  Gastrointestinal:  Positive for heartburn. Negative for abdominal pain, blood in stool, constipation, diarrhea, melena, nausea and vomiting.  Genitourinary:  Negative for dysuria, frequency and urgency.  Musculoskeletal:  Positive for joint pain. Negative for back pain.  Skin: Negative.  Negative for  itching and rash.  Neurological:  Negative for dizziness, tingling, focal weakness, weakness and headaches.  Endo/Heme/Allergies:  Does not bruise/bleed easily.  Psychiatric/Behavioral:  Negative for depression. The patient is not nervous/anxious and does not have insomnia.      MEDICAL HISTORY:  History reviewed. No pertinent past medical history.  SURGICAL HISTORY: History reviewed. No pertinent surgical history.  SOCIAL HISTORY: Social History   Socioeconomic History   Marital status: Married    Spouse name: Not on file   Number of children: Not on file   Years of education: Not on file   Highest education level: Not on file  Occupational History   Not on file  Tobacco Use   Smoking status: Never   Smokeless tobacco: Never  Substance and Sexual Activity   Alcohol use: Not Currently   Drug use: Never   Sexual activity: Yes    Birth control/protection: None  Other Topics Concern   Not on file  Social History Narrative      From [place]; worked for [employer]; niece in [place]; 3 children. Never smoked; No alcohol.    Social Determinants of Health   Financial Resource Strain: Not on file  Food Insecurity: Not on file  Transportation Needs: Not on file  Physical Activity: Not on file  Stress: Not on file  Social Connections: Not on file  Intimate Partner Violence: Not on file    FAMILY HISTORY: Family History  Problem Relation Age of Onset   Diabetes Mother     ALLERGIES:  is allergic to milk protein, peanut-containing drug products, and other.  MEDICATIONS:  Current Outpatient Medications  Medication Sig Dispense Refill   celecoxib (CELEBREX) 200 MG capsule Take by mouth.  polyethylene glycol powder (GLYCOLAX/MIRALAX) 17 GM/SCOOP powder Take 1 Container by mouth daily.     tamsulosin (FLOMAX) 0.4 MG CAPS capsule Take 1 capsule (0.4 mg total) by mouth daily. 90 capsule 3   No current facility-administered medications for this visit.     Marland Kitchen  PHYSICAL  EXAMINATION:   Vitals:   03/11/23 1316  BP: 113/70  Pulse: 63  Resp: 16  Temp: (!) 97.5 F (36.4 C)  SpO2: 100%   Filed Weights   03/11/23 1316  Weight: 143 lb 9.6 oz (65.1 kg)    Physical Exam Vitals and nursing note reviewed.  HENT:     Head: Normocephalic and atraumatic.     Mouth/Throat:     Pharynx: Oropharynx is clear.  Eyes:     Extraocular Movements: Extraocular movements intact.     Pupils: Pupils are equal, round, and reactive to light.  Cardiovascular:     Rate and Rhythm: Normal rate and regular rhythm.  Pulmonary:     Comments: Decreased breath sounds bilaterally.  Abdominal:     Palpations: Abdomen is soft.  Musculoskeletal:        General: Normal range of motion.     Cervical back: Normal range of motion.  Skin:    General: Skin is warm.  Neurological:     General: No focal deficit present.     Mental Status: He is alert and oriented to person, place, and time.  Psychiatric:        Behavior: Behavior normal.        Judgment: Judgment normal.      LABORATORY DATA:  I have reviewed the data as listed Lab Results  Component Value Date   WBC 2.3 (L) 03/11/2023   HGB 11.0 (L) 03/11/2023   HCT 33.4 (L) 03/11/2023   MCV 94.4 03/11/2023   PLT 160 03/11/2023   Recent Labs    09/15/22 1306 10/19/22 1320 03/11/23 1305  NA 136 135 135  K 3.9 4.2 4.1  CL 101 103 102  CO2 26 26 25   GLUCOSE 112* 123* 109*  BUN 20 25* 19  CREATININE 1.00 0.98 1.04  CALCIUM 9.1 9.3 9.2  GFRNONAA >60 >60 >60  PROT  --   --  7.1  ALBUMIN  --   --  4.5  AST  --   --  31  ALT  --   --  23  ALKPHOS  --   --  37*  BILITOT  --   --  0.7     No results found.  ASSESSMENT & PLAN:   Symptomatic anemia #Mild anemia: June 2023- hemoglobin 12; ferritin 35 iron saturation 30; LDH normal no evidence of any hemolysis.  Hepatitis work-up negative.    # AUG 2024- ferritin 67 [PCP]; hb 11- WBC 2.3; ANC 1.6; platlets- WNL- reocmmend bone marrow biopsy. Discussed with  the patient the bone marrow biopsy and aspiration indication and procedure at length.  Given significant discomfort involved-I would recommend under sedation/with radiology in the hospital. I discussed the potential complications include-bleeding/trauma and risk of infection; which are fortunately very rare.  Patient is in agreement. Patient will sign the consent prior to the procedure. Bone marrow biopsy/aspiration is ordered.   # Etiology of anemia/pancytopenia unclear : CT scan-July 2023 negative for cirrhosis /splenomegaly negative. EGD/colonoscopy in July 2024- hiatal hernia; gastritis/ duonodenitis- but no bleeding. See above.   # Elevated B12-elevated; recommend PO B12  q OD.   # Prostate cancer: s/p EBRT [2018]- followed  Urologist, [place] [[facility]]; AUG 2nd, 2024- 0.1.  # Fatigue: add TSH today; and check testosterone labs   # DISPOSITION: # ADD TSH # Bone marrow biopsy/aspiration re: anemia  # HOLD venofer today.  # follow up 1 months-MD: labs- cbc/bmp; folate; copper; zinc levels; testosterone levels-Dr.B    All questions were answered. The patient knows to call the clinic with any problems, questions or concerns.    Earna Coder, MD 03/11/2023 2:46 PM

## 2023-03-11 NOTE — Assessment & Plan Note (Addendum)
#  Mild anemia: June 2023- hemoglobin 12; ferritin 35 iron saturation 30; LDH normal no evidence of any hemolysis.  Hepatitis work-up negative.    # AUG 2024- ferritin 67 [PCP]; hb 11- WBC 2.3; ANC 1.6; platlets- WNL- reocmmend bone marrow biopsy. Discussed with the patient the bone marrow biopsy and aspiration indication and procedure at length.  Given significant discomfort involved-I would recommend under sedation/with radiology in the hospital. I discussed the potential complications include-bleeding/trauma and risk of infection; which are fortunately very rare.  Patient is in agreement. Patient will sign the consent prior to the procedure. Bone marrow biopsy/aspiration is ordered.   # Etiology of anemia/pancytopenia unclear : CT scan-July 2023 negative for cirrhosis /splenomegaly negative. EGD/colonoscopy in July 2024- hiatal hernia; gastritis/ duonodenitis- but no bleeding. See above.   # Elevated B12-elevated; recommend PO B12  q OD.   # Prostate cancer: s/p EBRT [2018]- followed Urologist, [place] [[facility]]; AUG 2nd, 2024- 0.1.  # Fatigue: add TSH today; and check testosterone labs   # DISPOSITION: # ADD TSH # Bone marrow biopsy/aspiration re: anemia  # HOLD venofer today.  # follow up 1 months-MD: labs- cbc/bmp; folate; copper; zinc levels; testosterone levels-Dr.B

## 2023-03-11 NOTE — Progress Notes (Signed)
Feels very tired all the time. Zero energy.Gets lightheaded if he gets up too quickly. No blood visible in stools. Chronic constipation, taking miralax. Eats 3 healthy meals per day.

## 2023-03-14 ENCOUNTER — Telehealth: Payer: Self-pay | Admitting: *Deleted

## 2023-03-14 NOTE — Telephone Encounter (Signed)
Pt accepted BM Bx appointment for Aug 22 with arrival time of 7:30 am. Instructed him to not eat or drink after midnight. He will need a driver with him. [facility] on the other side of the ER.

## 2023-03-22 ENCOUNTER — Other Ambulatory Visit: Payer: Self-pay | Admitting: Radiology

## 2023-03-22 DIAGNOSIS — Z01812 Encounter for preprocedural laboratory examination: Secondary | ICD-10-CM

## 2023-03-22 NOTE — H&P (Signed)
Chief Complaint: Patient was seen in consultation today for anemia/pancytopenia at the request of Brahmanday,Govinda R  Referring Physician(s): Earna Coder  Supervising Physician: Irish Lack  Patient Status: [facility] - Out-pt  History of Present Illness: Shane [name] is a 72 y.o. male PMHx significant for prostate cancer s/p EBRT + seed implant and anemia/pancytopenia, he has been seen by hematology with no source of anemia found and scheduled today for image guided bone marrow biopsy.   The patient denies any current hip pain, chest pain or shortness of breath. The patient denies any history of sleep apnea or chronic oxygen use. He has no known complications to sedation.    History reviewed. No pertinent past medical history.  History reviewed. No pertinent surgical history.  Allergies: Milk protein, Peanut-containing drug products, and Other  Medications: Prior to Admission medications   Medication Sig Start Date End Date Taking? Authorizing Provider  celecoxib (CELEBREX) 200 MG capsule Take by mouth. 10/25/21   [provider]  polyethylene glycol powder (GLYCOLAX/MIRALAX) 17 GM/SCOOP powder Take 1 Container by mouth daily.    [provider]  tamsulosin (FLOMAX) 0.4 MG CAPS capsule Take 1 capsule (0.4 mg total) by mouth daily. 05/22/20   Sondra Come, MD     Family History  Problem Relation Age of Onset   Diabetes Mother     Social History   Socioeconomic History   Marital status: Married    Spouse name: Not on file   Number of children: Not on file   Years of education: Not on file   Highest education level: Not on file  Occupational History   Not on file  Tobacco Use   Smoking status: Never   Smokeless tobacco: Never  Vaping Use   Vaping status: Never Used  Substance and Sexual Activity   Alcohol use: Not Currently   Drug use: Never   Sexual activity: Yes    Birth control/protection: None  Other Topics Concern    Not on file  Social History Narrative      Lives with wife Shane [name] and daughter Shane [name].  From [place]; worked for [employer]; niece in [place]; 3 children. Never smoked; No alcohol. No inside pets.    Social Determinants of Health   Financial Resource Strain: Not on file  Food Insecurity: Not on file  Transportation Needs: Not on file  Physical Activity: Not on file  Stress: Not on file  Social Connections: Not on file    Review of Systems: A 12 point ROS discussed and pertinent positives are indicated in the HPI above.  All other systems are negative.  Review of Systems  Vital Signs: BP (!) 142/88   Pulse 60   Temp 97.9 F (36.6 C) (Oral)   Resp 12   Ht 6\' 3"  (1.905 m)   Wt 145 lb (65.8 kg)   SpO2 100%   BMI 18.12 kg/m    Physical Exam Constitutional:      General: He is not in acute distress. HENT:     Head: Normocephalic and atraumatic.  Cardiovascular:     Rate and Rhythm: Normal rate and regular rhythm.  Pulmonary:     Effort: Pulmonary effort is normal. No respiratory distress.     Breath sounds: Normal breath sounds.  Neurological:     Mental Status: He is alert and oriented to person, place, and time.     Imaging: No results found.  Labs:  CBC: Recent Labs    09/15/22 1306 10/19/22 1320  03/11/23 1305 03/24/23 0742  WBC 2.8* 3.0* 2.3* 3.5*  HGB 10.4* 11.0* 11.0* 12.1*  HCT 30.9* 33.3* 33.4* 37.1*  PLT 149* 167 160 190    COAGS: No results for input(s): "INR", "APTT" in the last 8760 hours.  BMP: Recent Labs    09/15/22 1306 10/19/22 1320 03/11/23 1305  NA 136 135 135  K 3.9 4.2 4.1  CL 101 103 102  CO2 26 26 25   GLUCOSE 112* 123* 109*  BUN 20 25* 19  CALCIUM 9.1 9.3 9.2  CREATININE 1.00 0.98 1.04  GFRNONAA >60 >60 >60    LIVER FUNCTION TESTS: Recent Labs    03/11/23 1305  BILITOT 0.7  AST 31  ALT 23  ALKPHOS 37*  PROT 7.1  ALBUMIN 4.5    Assessment and Plan: This is a 72 year old male with PMHx significant for  prostate cancer s/p EBRT + seed implant and anemia/pancytopenia, he has been seen by hematology with no source of anemia found and scheduled today for image guided bone marrow biopsy.   The patient has been NPO, labs and vitals have been reviewed.  Risks and benefits of image guided bone marrow biopsy with moderate sedation was discussed with the patient and/or patient's family including, but not limited to bleeding, infection, damage to adjacent structures or low yield requiring additional tests.  All of the questions were answered and there is agreement to proceed.  Consent signed and in chart.   Thank you for this interesting consult.  I greatly enjoyed meeting Shane [name] and look forward to participating in their care.  A copy of this report was sent to the requesting provider on this date.  Electronically Signed: Berneta Levins, PA-C 03/24/2023, 8:18 AM   I spent a total of 15 Minutes  in face to face in clinical consultation, greater than 50% of which was counseling/coordinating care for anemia/pancytopenia.

## 2023-03-23 ENCOUNTER — Other Ambulatory Visit (HOSPITAL_COMMUNITY): Payer: Self-pay | Admitting: Student

## 2023-03-23 NOTE — Progress Notes (Signed)
Patient for IR Bone Marrow Biopsy on Thurs 03/24/2023, I called and LVM for the patient on the phone and gave pre-procedure instructions. VM made the pt aware to be here at 7:30a, NPO after MN prior to procedure as well as driver post procedure/recovery/discharge. Called 03/23/2023

## 2023-03-24 ENCOUNTER — Other Ambulatory Visit: Payer: Self-pay

## 2023-03-24 ENCOUNTER — Ambulatory Visit
Admission: RE | Admit: 2023-03-24 | Discharge: 2023-03-24 | Disposition: A | Discharge status: Home / Self Care | Payer: Medicare Other | Source: Ambulatory Visit | Attending: Internal Medicine | Admitting: Internal Medicine

## 2023-03-24 DIAGNOSIS — D649 Anemia, unspecified: Secondary | ICD-10-CM | POA: Insufficient documentation

## 2023-03-24 DIAGNOSIS — Z8546 Personal history of malignant neoplasm of prostate: Secondary | ICD-10-CM | POA: Insufficient documentation

## 2023-03-24 DIAGNOSIS — D61818 Other pancytopenia: Secondary | ICD-10-CM | POA: Diagnosis present

## 2023-03-24 DIAGNOSIS — Z01812 Encounter for preprocedural laboratory examination: Secondary | ICD-10-CM

## 2023-03-24 LAB — CBC WITH DIFFERENTIAL/PLATELET
Abs Immature Granulocytes: 0.01 10*3/uL (ref 0.00–0.07)
Basophils Absolute: 0 10*3/uL (ref 0.0–0.1)
Basophils Relative: 1 %
Eosinophils Absolute: 0.1 10*3/uL (ref 0.0–0.5)
Eosinophils Relative: 2 %
HCT: 37.1 % — ABNORMAL LOW (ref 39.0–52.0)
Hemoglobin: 12.1 g/dL — ABNORMAL LOW (ref 13.0–17.0)
Immature Granulocytes: 0 %
Lymphocytes Relative: 34 %
Lymphs Abs: 1.2 10*3/uL (ref 0.7–4.0)
MCH: 30.9 pg (ref 26.0–34.0)
MCHC: 32.6 g/dL (ref 30.0–36.0)
MCV: 94.9 fL (ref 80.0–100.0)
Monocytes Absolute: 0.3 10*3/uL (ref 0.1–1.0)
Monocytes Relative: 7 %
Neutro Abs: 2 10*3/uL (ref 1.7–7.7)
Neutrophils Relative %: 56 %
Platelets: 190 10*3/uL (ref 150–400)
RBC: 3.91 MIL/uL — ABNORMAL LOW (ref 4.22–5.81)
RDW: 14.1 % (ref 11.5–15.5)
WBC: 3.5 10*3/uL — ABNORMAL LOW (ref 4.0–10.5)
nRBC: 0 % (ref 0.0–0.2)

## 2023-03-24 MED ORDER — FENTANYL CITRATE (PF) 100 MCG/2ML IJ SOLN
INTRAMUSCULAR | Status: AC | PRN
Start: 2023-03-24 — End: 2023-03-24
  Administered 2023-03-24: 25 ug via INTRAVENOUS
  Administered 2023-03-24: 50 ug via INTRAVENOUS

## 2023-03-24 MED ORDER — HEPARIN SOD (PORK) LOCK FLUSH 100 UNIT/ML IV SOLN
500.0000 [IU] | Freq: Once | INTRAVENOUS | Status: AC
  Filled 2023-03-24: qty 5

## 2023-03-24 MED ORDER — FENTANYL CITRATE (PF) 100 MCG/2ML IJ SOLN
INTRAMUSCULAR | Status: AC
  Filled 2023-03-24: qty 2

## 2023-03-24 MED ORDER — MIDAZOLAM HCL 5 MG/5ML IJ SOLN
INTRAMUSCULAR | Status: AC | PRN
  Administered 2023-03-24: .5 mg via INTRAVENOUS

## 2023-03-24 MED ORDER — HEPARIN SOD (PORK) LOCK FLUSH 100 UNIT/ML IV SOLN
INTRAVENOUS | Status: AC
  Administered 2023-03-24: 500 [IU] via INTRAVENOUS
  Filled 2023-03-24: qty 5

## 2023-03-24 MED ORDER — MIDAZOLAM HCL 2 MG/2ML IJ SOLN
INTRAMUSCULAR | Status: AC
  Filled 2023-03-24: qty 2

## 2023-03-24 MED ORDER — SODIUM CHLORIDE 0.9 % IV SOLN: INTRAVENOUS | Status: DC

## 2023-03-24 MED ORDER — LIDOCAINE HCL (PF) 1 % IJ SOLN
10.0000 mL | Freq: Once | INTRAMUSCULAR | Status: AC
  Administered 2023-03-24: 10 mL via INTRADERMAL
  Filled 2023-03-24: qty 10

## 2023-03-24 MED ORDER — MIDAZOLAM HCL 2 MG/2ML IJ SOLN
INTRAMUSCULAR | Status: AC | PRN
  Administered 2023-03-24: 1 mg via INTRAVENOUS

## 2023-03-24 NOTE — Progress Notes (Signed)
Patient clinically stable post CT BMB per Dr Fredia Sorrow, tolerated well. Vitals stable pre and post procedure. Received Versed 1.5 mg along with Fentanyl 75 mcg IV for procedure. Report given to Kellie Moor / specials/18

## 2023-03-24 NOTE — Procedures (Signed)
Interventional Radiology Procedure Note  Procedure: CT guided bone marrow aspiration and biopsy  Complications: None  EBL: < 10 mL  Findings: Aspirate and core biopsy performed of bone marrow in right iliac bone.  Plan: Bedrest supine x 1 hrs  Glenn T. Yamagata, M.D Pager:  [phone]   

## 2023-03-30 LAB — SURGICAL PATHOLOGY

## 2023-03-31 ENCOUNTER — Telehealth: Payer: Self-pay | Admitting: *Deleted

## 2023-03-31 NOTE — Telephone Encounter (Signed)
I called and spoke with a pathologist at [facility]. He will be adding NGS testing as requested by Dr Donneta Romberg .

## 2023-04-07 ENCOUNTER — Other Ambulatory Visit: Payer: Self-pay | Admitting: Internal Medicine

## 2023-04-07 DIAGNOSIS — M26609 Unspecified temporomandibular joint disorder, unspecified side: Secondary | ICD-10-CM

## 2023-04-07 LAB — SURGICAL PATHOLOGY

## 2023-04-08 ENCOUNTER — Ambulatory Visit
Admission: RE | Admit: 2023-04-08 | Discharge: 2023-04-08 | Disposition: A | Discharge status: Home / Self Care | Payer: Medicare Other | Source: Ambulatory Visit | Attending: Internal Medicine | Admitting: Internal Medicine

## 2023-04-08 DIAGNOSIS — M26609 Unspecified temporomandibular joint disorder, unspecified side: Secondary | ICD-10-CM

## 2023-04-09 IMAGING — CT CT ABD-PELV W/ CM
1 of 3 series · 13 of 32 positions shown, 18 images · IV contrast (agent unspecified)
Comparison: None Available.

CLINICAL DATA: Unintentional weight loss. Recurrent epigastric
pain. History of prostate cancer with brachytherapy seed implants

EXAM:
CT ABDOMEN AND PELVIS WITH CONTRAST
TECHNIQUE: Multidetector CT imaging of the abdomen and pelvis was performed
using the standard protocol following bolus administration of
intravenous contrast.

[Series 2: a/p w/ 5mm · axial · 0.73mm/px · z∈[-479,-74]mm · 13 of 93 slices shown, 18 images]
[im 6/93  soft-tissue]
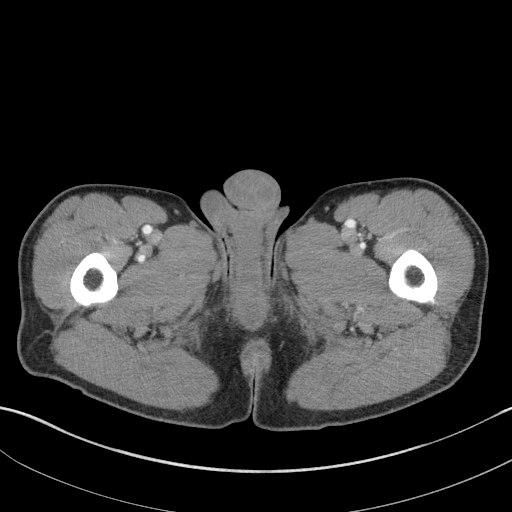
[im 6/93  bone]
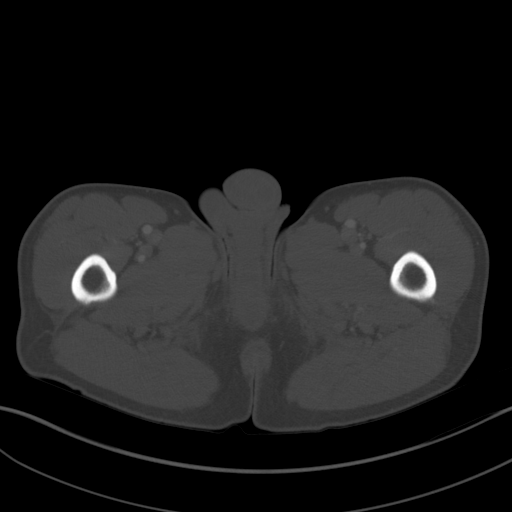
[im 16/93  soft-tissue]
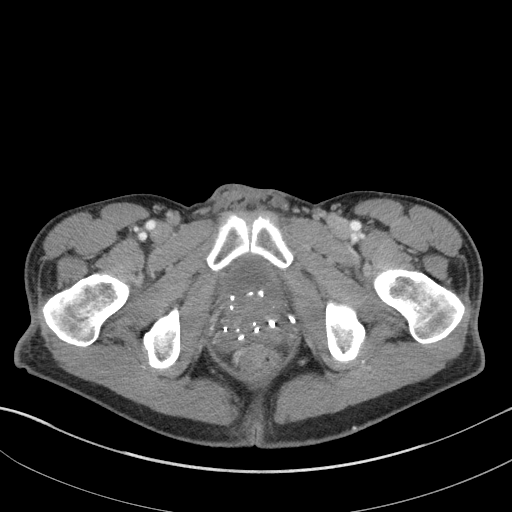
[im 21/93  soft-tissue]
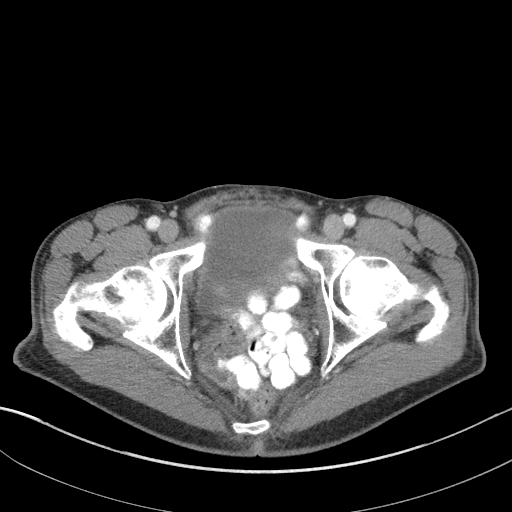
[im 26/93  soft-tissue]
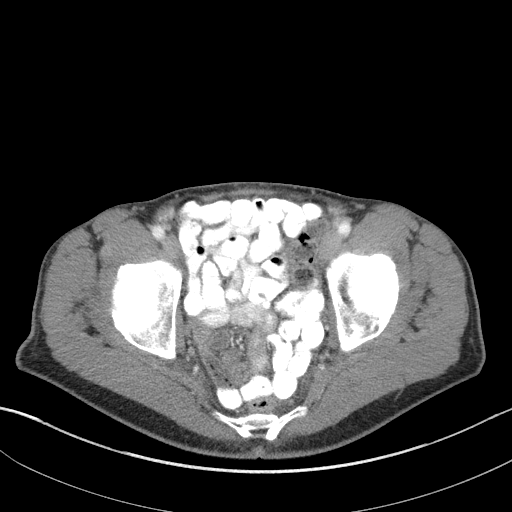
[im 36/93  soft-tissue]
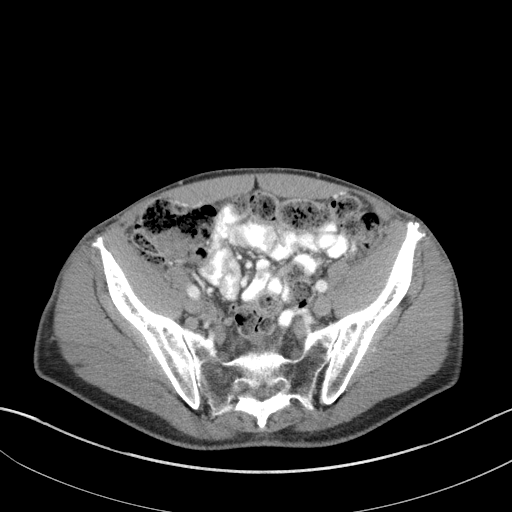
[im 41/93  soft-tissue]
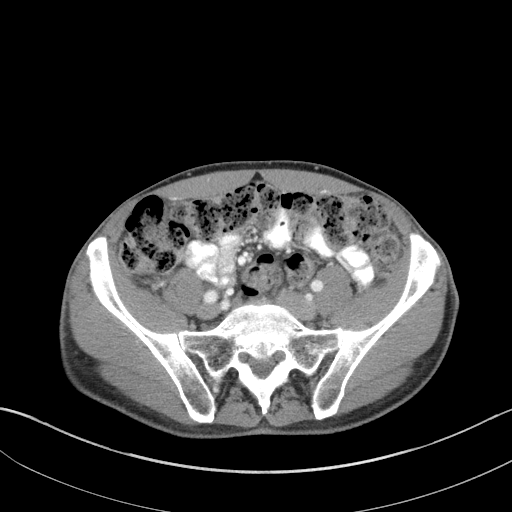
[im 52/93  soft-tissue]
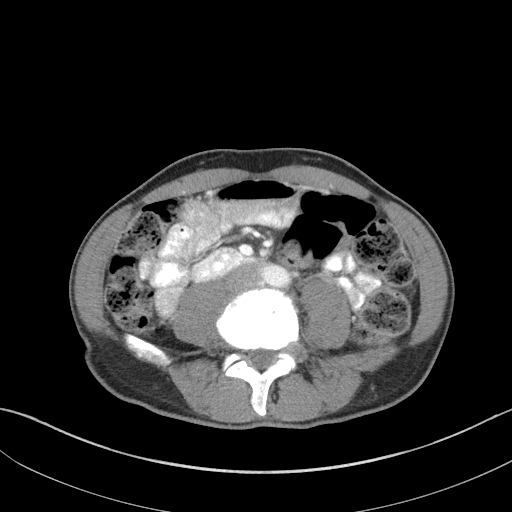
[im 57/93  soft-tissue]
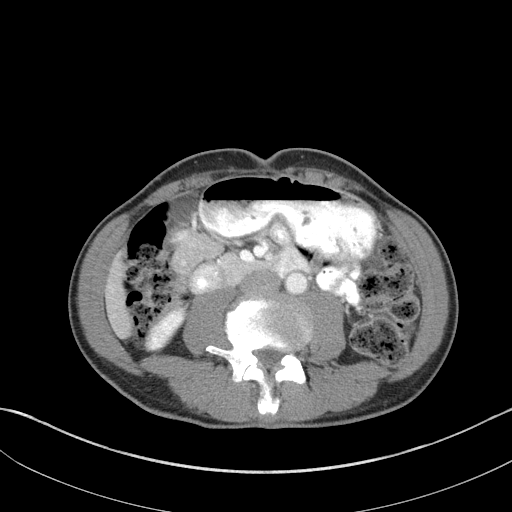
[im 67/93  soft-tissue]
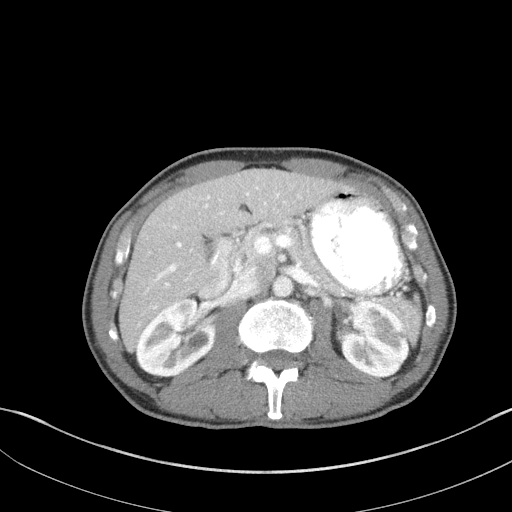
[im 67/93  bone]
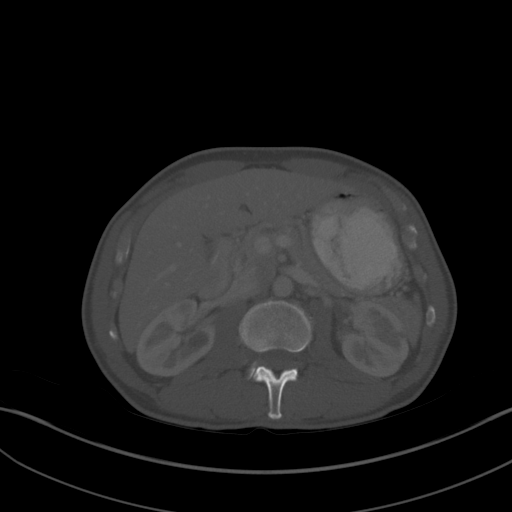
[im 72/93  soft-tissue]
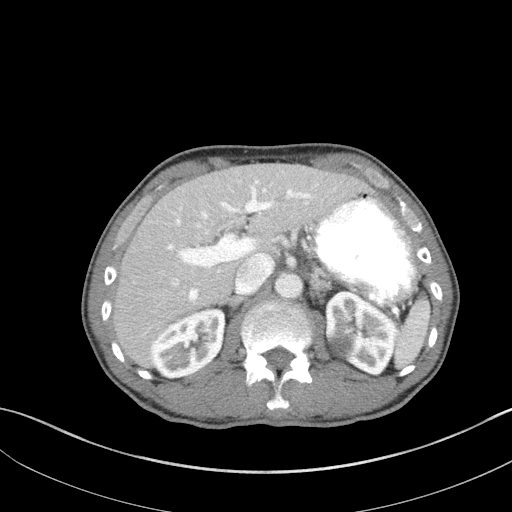
[im 72/93  lung]
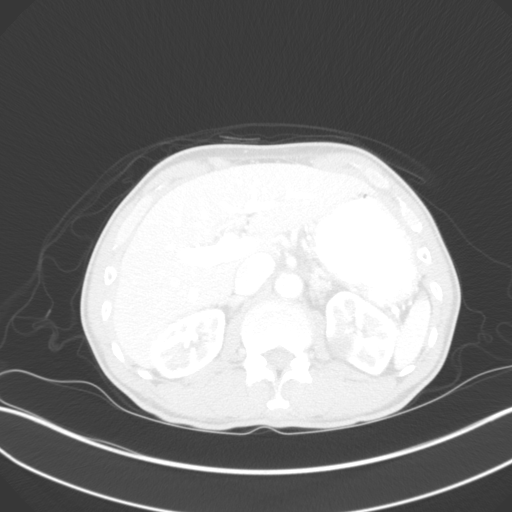
[im 77/93  soft-tissue]
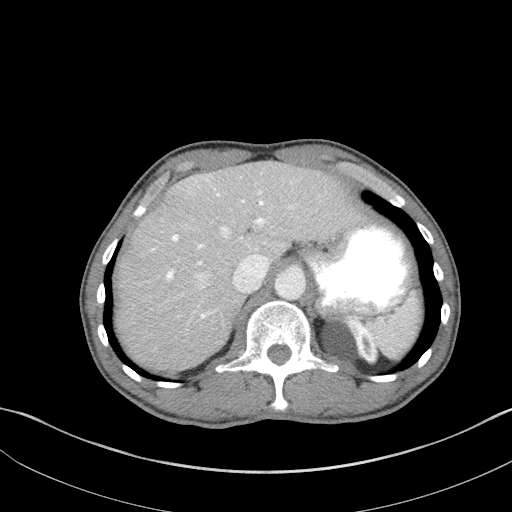
[im 77/93  lung]
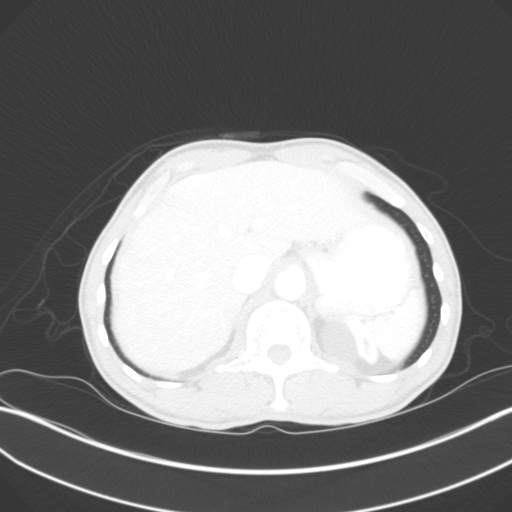
[im 82/93  lung]
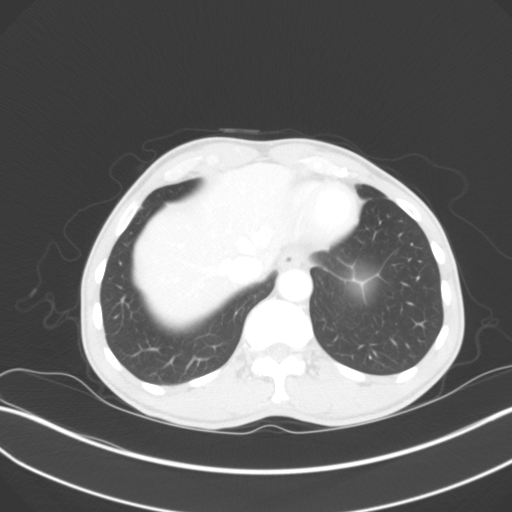
[im 87/93  soft-tissue]
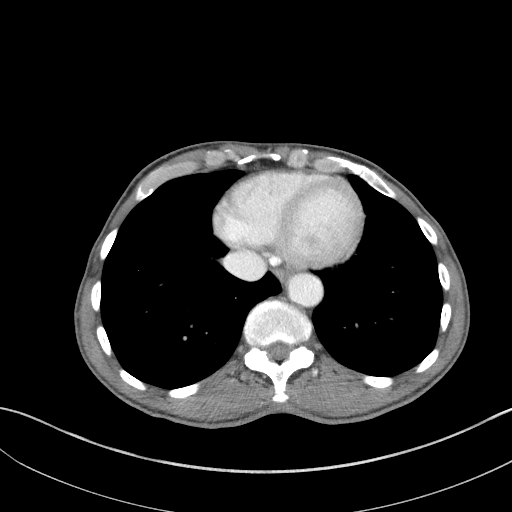
[im 87/93  lung]
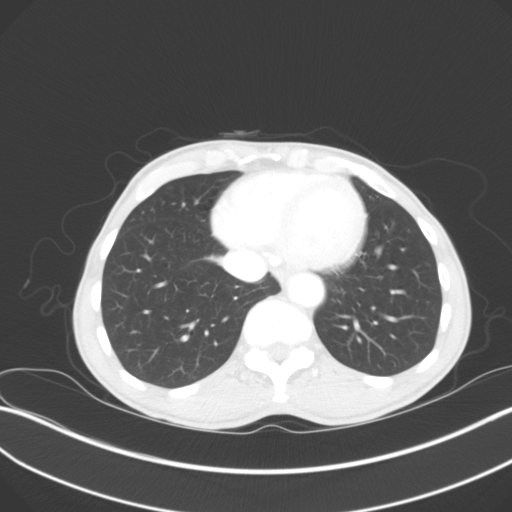

[13 of 32 positions shown; findings below may reference images not displayed]

RADIATION DOSE REDUCTION: This exam was performed according to the
departmental dose-optimization program which includes automated
exposure control, adjustment of the mA and/or kV according to
patient size and/or use of iterative reconstruction technique.

CONTRAST:  100mL 6OY4KC-0EE IOPAMIDOL (6OY4KC-0EE) INJECTION 61%
FINDINGS: Lower chest: Lung bases are clear.

Hepatobiliary: No focal hepatic lesion. No biliary duct dilatation.
Common bile duct is normal.

Pancreas: Pancreas is normal. No ductal dilatation. No pancreatic
inflammation.

Spleen: Normal spleen

Adrenals/urinary tract: Adrenal glands are normal. No enhancing
renal cortical lesion. No renal obstruction. Ureters and bladder
normal.

3.7 cm simple fluid attenuation cyst of the LEFT kidney. Three
smaller simple fluid attenuation cyst of the RIGHT kidney.

Stomach/Bowel: Stomach, small bowel, appendix, and cecum are normal.
The colon and rectosigmoid colon are normal. Moderate volume stool
throughout the colon.

Vascular/Lymphatic: Abdominal aorta is normal caliber. No periportal
or retroperitoneal adenopathy. No pelvic adenopathy.

Reproductive: Brachytherapy seeds within the prostate gland. No
pelvic lymphadenopathy. No periaortic adenopathy.

Other: No free fluid.

Musculoskeletal: No suspicious lytic or blastic lesions identified.
Sclerotic focus in the LEFT iliac wing measuring 17 mm (image 52/2)
is favored benign. Degenerative changes in lumbar spine at L4-L5.
IMPRESSION: 1. No explanation for epigastric pain or weight loss.
2. Bilateral benign Bosniak 2 renal cysts. No specific follow-up
recommended.
3. Brachytherapy seeds in the prostate gland. No evidence of
prostate cancer metastasis.

## 2023-04-11 ENCOUNTER — Encounter: Payer: Self-pay | Admitting: Internal Medicine

## 2023-04-11 ENCOUNTER — Inpatient Hospital Stay: Payer: Medicare Other | Attending: Internal Medicine

## 2023-04-11 ENCOUNTER — Inpatient Hospital Stay (HOSPITAL_BASED_OUTPATIENT_CLINIC_OR_DEPARTMENT_OTHER): Payer: Medicare Other | Admitting: Internal Medicine

## 2023-04-11 VITALS — BP 108/73 | HR 76 | Temp 97.0°F | Resp 18 | Ht 75.0 in

## 2023-04-11 DIAGNOSIS — D649 Anemia, unspecified: Secondary | ICD-10-CM | POA: Insufficient documentation

## 2023-04-11 DIAGNOSIS — C61 Malignant neoplasm of prostate: Secondary | ICD-10-CM | POA: Diagnosis present

## 2023-04-11 LAB — CBC WITH DIFFERENTIAL (CANCER CENTER ONLY)
Abs Immature Granulocytes: 0 10*3/uL (ref 0.00–0.07)
Basophils Absolute: 0 10*3/uL (ref 0.0–0.1)
Basophils Relative: 0 %
Eosinophils Absolute: 0 10*3/uL (ref 0.0–0.5)
Eosinophils Relative: 1 %
HCT: 34.3 % — ABNORMAL LOW (ref 39.0–52.0)
Hemoglobin: 11.3 g/dL — ABNORMAL LOW (ref 13.0–17.0)
Immature Granulocytes: 0 %
Lymphocytes Relative: 24 %
Lymphs Abs: 0.7 10*3/uL (ref 0.7–4.0)
MCH: 31 pg (ref 26.0–34.0)
MCHC: 32.9 g/dL (ref 30.0–36.0)
MCV: 94 fL (ref 80.0–100.0)
Monocytes Absolute: 0.2 10*3/uL (ref 0.1–1.0)
Monocytes Relative: 7 %
Neutro Abs: 2 10*3/uL (ref 1.7–7.7)
Neutrophils Relative %: 68 %
Platelet Count: 162 10*3/uL (ref 150–400)
RBC: 3.65 MIL/uL — ABNORMAL LOW (ref 4.22–5.81)
RDW: 13.5 % (ref 11.5–15.5)
WBC Count: 2.9 10*3/uL — ABNORMAL LOW (ref 4.0–10.5)
nRBC: 0 % (ref 0.0–0.2)

## 2023-04-11 LAB — BASIC METABOLIC PANEL
Anion gap: 6 (ref 5–15)
BUN: 23 mg/dL (ref 8–23)
CO2: 26 mmol/L (ref 22–32)
Calcium: 9.1 mg/dL (ref 8.9–10.3)
Chloride: 102 mmol/L (ref 98–111)
Creatinine, Ser: 1.11 mg/dL (ref 0.61–1.24)
GFR, Estimated: >60 mL/min (ref >60)
Glucose, Bld: 113 mg/dL — ABNORMAL HIGH (ref 70–99)
Potassium: 3.8 mmol/L (ref 3.5–5.1)
Sodium: 134 mmol/L — ABNORMAL LOW (ref 135–145)

## 2023-04-11 LAB — FOLATE: Folate: >40 ng/mL (ref >5.9)

## 2023-04-11 NOTE — Progress Notes (Signed)
Patient states he still has no energy despite starting oral iron. Had Bone marrow biopsy done 2 weeks ago. L shoulder pain is hurting more than usual. No visible in stool. Lightheaded if standing too quickly. Appetite is good.

## 2023-04-11 NOTE — Progress Notes (Signed)
[facility] CONSULT NOTE  Patient Care Team: Enid Baas, MD as PCP - General (Internal Medicine) Earna Coder, MD as Consulting Physician (Oncology)  CHIEF COMPLAINTS/PURPOSE OF CONSULTATION: ANEMIA  HEMATOLOGY HISTORY  # ANEMIA[ APRIL- 2023-;PCP; Hb12 platelets- WBC;-2.3 [ANC/Lymphopenia]; Iron sat-24%; ferritin-22 [Lower limit- >23];  GFR-80 CT/US- ;  EGD/colonoscopy-[early 2020; [place]]- MAY 2023- [facility] [m[facility]]; June 2023 iron saturation 30% ferritin 35; hemolysis labs negative; hepatitis B and C- July 2023-gentle iron; Hx of bone marrow [> 20 years - abnormal blood counts]  # PROSTATE CANCER [2018; "Gleason score=7"] s/p EBRT+ seed implants  HISTORY OF PRESENTING ILLNESS: Alone.  Ambulating independently.  Shane [name] 72 y.o.  male pleasant patient with ongoing fatigue /mild anemia history of prostate cancer is here for follow-up/review review results of the bone marrow biopsy.  Patient states he still has no energy despite starting oral iron. Had Bone marrow biopsy done 2 weeks ago. L shoulder pain is hurting more than usual. No visible in stool.  Lightheaded if standing too quickly. Appetite is good   Chronic constipation, taking miralax. Eats 3 healthy meals per day. Denies any dyspnea. Appetite is good.  No weight loss.   Review of Systems  Constitutional:  Positive for malaise/fatigue. Negative for chills, diaphoresis, fever and weight loss.  HENT:  Negative for nosebleeds and sore throat.   Eyes:  Negative for double vision.  Respiratory:  Negative for cough, hemoptysis, sputum production, shortness of breath and wheezing.   Cardiovascular:  Negative for chest pain, palpitations, orthopnea and leg swelling.  Gastrointestinal:  Positive for heartburn. Negative for abdominal pain, blood in stool, constipation, diarrhea, melena, nausea and vomiting.  Genitourinary:  Negative for dysuria, frequency and urgency.  Musculoskeletal:  Positive  for joint pain. Negative for back pain.  Skin: Negative.  Negative for itching and rash.  Neurological:  Negative for dizziness, tingling, focal weakness, weakness and headaches.  Endo/Heme/Allergies:  Does not bruise/bleed easily.  Psychiatric/Behavioral:  Negative for depression. The patient is not nervous/anxious and does not have insomnia.      MEDICAL HISTORY:  History reviewed. No pertinent past medical history.  SURGICAL HISTORY: History reviewed. No pertinent surgical history.  SOCIAL HISTORY: Social History   Socioeconomic History   Marital status: Married    Spouse name: Not on file   Number of children: Not on file   Years of education: Not on file   Highest education level: Not on file  Occupational History   Not on file  Tobacco Use   Smoking status: Never   Smokeless tobacco: Never  Vaping Use   Vaping status: Never Used  Substance and Sexual Activity   Alcohol use: Not Currently   Drug use: Never   Sexual activity: Yes    Birth control/protection: None  Other Topics Concern   Not on file  Social History Narrative      Lives with wife Johnny Bridge and daughter Lanora Manis.  From [place]; worked for [employer]; niece in [place]; 3 children. Never smoked; No alcohol. No inside pets.    Social Determinants of Health   Financial Resource Strain: Not on file  Food Insecurity: Not on file  Transportation Needs: Not on file  Physical Activity: Not on file  Stress: Not on file  Social Connections: Not on file  Intimate Partner Violence: Not on file    FAMILY HISTORY: Family History  Problem Relation Age of Onset   Diabetes Mother     ALLERGIES:  is allergic to  milk protein, peanut-containing drug products, and other.  MEDICATIONS:  Current Outpatient Medications  Medication Sig Dispense Refill   celecoxib (CELEBREX) 200 MG capsule Take by mouth.     polyethylene glycol powder (GLYCOLAX/MIRALAX) 17 GM/SCOOP powder Take 1 Container by mouth daily.      tamsulosin (FLOMAX) 0.4 MG CAPS capsule Take 1 capsule (0.4 mg total) by mouth daily. 90 capsule 3   No current facility-administered medications for this visit.     Marland Kitchen  PHYSICAL EXAMINATION:   Vitals:   04/11/23 1354  BP: 108/73  Pulse: 76  Resp: 18  Temp: (!) 97 F (36.1 C)  SpO2: 100%    There were no vitals filed for this visit.   Physical Exam Vitals and nursing note reviewed.  HENT:     Head: Normocephalic and atraumatic.     Mouth/Throat:     Pharynx: Oropharynx is clear.  Eyes:     Extraocular Movements: Extraocular movements intact.     Pupils: Pupils are equal, round, and reactive to light.  Cardiovascular:     Rate and Rhythm: Normal rate and regular rhythm.  Pulmonary:     Comments: Decreased breath sounds bilaterally.  Abdominal:     Palpations: Abdomen is soft.  Musculoskeletal:        General: Normal range of motion.     Cervical back: Normal range of motion.  Skin:    General: Skin is warm.  Neurological:     General: No focal deficit present.     Mental Status: He is alert and oriented to person, place, and time.  Psychiatric:        Behavior: Behavior normal.        Judgment: Judgment normal.      LABORATORY DATA:  I have reviewed the data as listed Lab Results  Component Value Date   WBC 2.9 (L) 04/11/2023   HGB 11.3 (L) 04/11/2023   HCT 34.3 (L) 04/11/2023   MCV 94.0 04/11/2023   PLT 162 04/11/2023   Recent Labs    10/19/22 1320 03/11/23 1305 04/11/23 1317  NA 135 135 134*  K 4.2 4.1 3.8  CL 103 102 102  CO2 26 25 26   GLUCOSE 123* 109* 113*  BUN 25* 19 23  CREATININE 0.98 1.04 1.11  CALCIUM 9.3 9.2 9.1  GFRNONAA >60 >60 >60  PROT  --  7.1  --   ALBUMIN  --  4.5  --   AST  --  31  --   ALT  --  23  --   ALKPHOS  --  37*  --   BILITOT  --  0.7  --      CT BONE MARROW BIOPSY & ASPIRATION  Result Date: 03/24/2023 CLINICAL DATA:  Anemia and pancytopenia. EXAM: CT GUIDED BONE MARROW ASPIRATION AND BIOPSY  ANESTHESIA/SEDATION: Moderate (conscious) sedation was employed during this procedure. A total of Versed 1.5 mg and Fentanyl 75 mcg was administered intravenously by radiology nursing. Moderate Sedation Time: 16 minutes. The patient's level of consciousness and vital signs were monitored continuously by radiology nursing throughout the procedure under my direct supervision. PROCEDURE: The procedure risks, benefits, and alternatives were explained to the patient. Questions regarding the procedure were encouraged and answered. The patient understands and consents to the procedure. A time out was performed prior to initiating the procedure. The right gluteal region was prepped with chlorhexidine. Sterile gown and sterile gloves were used for the procedure. Local anesthesia was provided with 1% Lidocaine. Under  CT guidance, an 11 gauge On Control bone cutting needle was advanced from a posterior approach into the right iliac bone. Needle positioning was confirmed with CT. Initial non heparinized and heparinized aspirate samples were obtained of bone marrow. Core biopsy was performed via the On Control drill needle. COMPLICATIONS: None FINDINGS: Inspection of initial aspirate did reveal visible particles. Intact core biopsy sample was obtained. IMPRESSION: CT guided bone marrow biopsy of right posterior iliac bone with both aspirate and core samples obtained. Electronically Signed   By: Irish Lack M.D.   On: 03/24/2023 09:31    ASSESSMENT & PLAN:   Symptomatic anemia #Mild anemia: June 2023- hemoglobin 12; ferritin 35 iron saturation 30; LDH normal no evidence of any hemolysis.  Hepatitis work-up negative.  AUG 2024- ferritin 67 [PCP]; hb 11- WBC 2.3; ANC 1.6; platlets- WNL- # CT scan-July 2023 negative for cirrhosis /splenomegaly negative. EGD/colonoscopy in July 2024- hiatal hernia; gastritis/ duonodenitis- but no bleeding.   # AUG 2024-  bone marrow biopsy"BONE MARROW, ASPIRATE, CLOT, CORE: -  Borderline  hypocellular bone marrow with otherwise orderly trilineage hematopoiesis; : The bone marrow is mildly hypocellular for age but with otherwise no significant morphologic abnormality in any of the 3 lineages.  A lymphoid aggregate is identified in the aspirate smear is well; however, there is no immunophenotypic evidence of a lymphoproliferative sorter by flow cytometry. NGS pending- NOT on celebrex for last one month- causing upset stomach.   # Intermittent dizziness: ? Hypotension- unlikely Flomax; recommend checking with PCP/Urology.  #Compression stockings both legs.  Wear them during daytime.  Take them off at night; and keep the legs propped up in bed. You can find them online or at stores- like [employer], [place].   # Elevated B12-elevated; recommend PO B12  q OD.   # Prostate cancer: s/p EBRT [2018]- followed Urologist, [facility]]; AUG 2nd, 2024- 0.1.  # Fatigue: TSH AUg 2024- WNL./ testosterone labs-pending.   # DISPOSITION: # follow up 6 months-MD: labs- cbc/bmp;iron studies; ferritin; LDH- Dr.B     All questions were answered. The patient knows to call the clinic with any problems, questions or concerns.    Earna Coder, MD 04/11/2023 3:07 PM

## 2023-04-11 NOTE — Assessment & Plan Note (Addendum)
#  Mild anemia: June 2023- hemoglobin 12; ferritin 35 iron saturation 30; LDH normal no evidence of any hemolysis.  Hepatitis work-up negative.  AUG 2024- ferritin 67 [PCP]; hb 11- WBC 2.3; ANC 1.6; platlets- WNL- # CT scan-July 2023 negative for cirrhosis /splenomegaly negative. EGD/colonoscopy in July 2024- hiatal hernia; gastritis/ duonodenitis- but no bleeding.   # AUG 2024-  bone marrow biopsy"BONE MARROW, ASPIRATE, CLOT, CORE: -  Borderline hypocellular bone marrow with otherwise orderly trilineage hematopoiesis; : The bone marrow is mildly hypocellular for age but with otherwise no significant morphologic abnormality in any of the 3 lineages.  A lymphoid aggregate is identified in the aspirate smear is well; however, there is no immunophenotypic evidence of a lymphoproliferative sorter by flow cytometry. NGS pending- NOT on celebrex for last one month- causing upset stomach.   # Intermittent dizziness: ? Hypotension- unlikely Flomax; recommend checking with PCP/Urology.  #Compression stockings both legs.  Wear them during daytime.  Take them off at night; and keep the legs propped up in bed. You can find them online or at stores- like [employer], [place].   # Elevated B12-elevated; recommend PO B12  q OD.   # Prostate cancer: s/p EBRT [2018]- followed Urologist, [facility]]; AUG 2nd, 2024- 0.1.  # Fatigue: TSH AUg 2024- WNL./ testosterone labs-pending.   # DISPOSITION: # follow up 6 months-MD: labs- cbc/bmp;iron studies; ferritin; LDH- Dr.B

## 2023-04-11 NOTE — Patient Instructions (Signed)
# 

## 2023-04-12 ENCOUNTER — Encounter (HOSPITAL_COMMUNITY): Payer: Self-pay

## 2023-04-12 LAB — TESTOSTERONE: Testosterone: 308 ng/dL (ref 264–916)

## 2023-04-13 LAB — COPPER, SERUM: Copper: 77 ug/dL (ref 69–132)

## 2023-04-13 LAB — ZINC: Zinc: 90 ug/dL (ref 44–115)

## 2023-04-19 ENCOUNTER — Encounter (HOSPITAL_COMMUNITY): Payer: Self-pay

## 2023-08-17 ENCOUNTER — Other Ambulatory Visit: Payer: Self-pay | Admitting: Physician Assistant

## 2023-08-17 ENCOUNTER — Encounter: Payer: Self-pay | Admitting: Internal Medicine

## 2023-08-17 DIAGNOSIS — C61 Malignant neoplasm of prostate: Secondary | ICD-10-CM

## 2023-08-17 DIAGNOSIS — R911 Solitary pulmonary nodule: Secondary | ICD-10-CM

## 2023-08-17 DIAGNOSIS — M5013 Cervical disc disorder with radiculopathy, cervicothoracic region: Secondary | ICD-10-CM

## 2023-08-26 ENCOUNTER — Encounter: Payer: Self-pay | Admitting: Internal Medicine

## 2023-08-26 ENCOUNTER — Ambulatory Visit
Admission: RE | Admit: 2023-08-26 | Discharge: 2023-08-26 | Disposition: A | Discharge status: Home / Self Care | Payer: Medicare Other | Source: Ambulatory Visit | Attending: Physician Assistant | Admitting: Physician Assistant

## 2023-08-26 DIAGNOSIS — R911 Solitary pulmonary nodule: Secondary | ICD-10-CM

## 2023-08-26 DIAGNOSIS — C61 Malignant neoplasm of prostate: Secondary | ICD-10-CM

## 2023-08-26 MED ORDER — IOPAMIDOL (ISOVUE-300) INJECTION 61%
75.0000 mL | Freq: Once | INTRAVENOUS | Status: AC | PRN
  Administered 2023-08-26: 75 mL via INTRAVENOUS

## 2023-08-29 ENCOUNTER — Ambulatory Visit
Admission: RE | Admit: 2023-08-29 | Discharge: 2023-08-29 | Disposition: A | Discharge status: Home / Self Care | Payer: Medicare Other | Source: Ambulatory Visit | Attending: Physician Assistant | Admitting: Physician Assistant

## 2023-08-29 DIAGNOSIS — M5013 Cervical disc disorder with radiculopathy, cervicothoracic region: Secondary | ICD-10-CM

## 2023-10-11 ENCOUNTER — Other Ambulatory Visit: Payer: Medicare Other

## 2023-10-11 ENCOUNTER — Ambulatory Visit: Payer: Medicare Other | Admitting: Internal Medicine

## 2023-10-17 ENCOUNTER — Encounter: Payer: Self-pay | Admitting: Internal Medicine

## 2023-10-17 ENCOUNTER — Inpatient Hospital Stay: Payer: Medicare Other | Attending: Internal Medicine

## 2023-10-17 ENCOUNTER — Inpatient Hospital Stay (HOSPITAL_BASED_OUTPATIENT_CLINIC_OR_DEPARTMENT_OTHER): Payer: Medicare Other | Admitting: Internal Medicine

## 2023-10-17 VITALS — BP 107/73 | HR 81 | Temp 97.5°F | Resp 16 | Ht 75.0 in | Wt 140.6 lb

## 2023-10-17 DIAGNOSIS — D649 Anemia, unspecified: Secondary | ICD-10-CM

## 2023-10-17 DIAGNOSIS — R5383 Other fatigue: Secondary | ICD-10-CM | POA: Insufficient documentation

## 2023-10-17 DIAGNOSIS — M1909 Primary osteoarthritis, other specified site: Secondary | ICD-10-CM | POA: Insufficient documentation

## 2023-10-17 DIAGNOSIS — C61 Malignant neoplasm of prostate: Secondary | ICD-10-CM | POA: Insufficient documentation

## 2023-10-17 LAB — CBC WITH DIFFERENTIAL (CANCER CENTER ONLY)
Abs Immature Granulocytes: 0.01 10*3/uL (ref 0.00–0.07)
Basophils Absolute: 0 10*3/uL (ref 0.0–0.1)
Basophils Relative: 0 %
Eosinophils Absolute: 0 10*3/uL (ref 0.0–0.5)
Eosinophils Relative: 1 %
HCT: 33.4 % — ABNORMAL LOW (ref 39.0–52.0)
Hemoglobin: 11 g/dL — ABNORMAL LOW (ref 13.0–17.0)
Immature Granulocytes: 0 %
Lymphocytes Relative: 24 %
Lymphs Abs: 0.8 10*3/uL (ref 0.7–4.0)
MCH: 31.4 pg (ref 26.0–34.0)
MCHC: 32.9 g/dL (ref 30.0–36.0)
MCV: 95.4 fL (ref 80.0–100.0)
Monocytes Absolute: 0.3 10*3/uL (ref 0.1–1.0)
Monocytes Relative: 8 %
Neutro Abs: 2.3 10*3/uL (ref 1.7–7.7)
Neutrophils Relative %: 67 %
Platelet Count: 171 10*3/uL (ref 150–400)
RBC: 3.5 MIL/uL — ABNORMAL LOW (ref 4.22–5.81)
RDW: 13.9 % (ref 11.5–15.5)
WBC Count: 3.4 10*3/uL — ABNORMAL LOW (ref 4.0–10.5)
nRBC: 0 % (ref 0.0–0.2)

## 2023-10-17 LAB — IRON AND TIBC
Iron: 77 ug/dL (ref 45–182)
Saturation Ratios: 24 % (ref 17.9–39.5)
TIBC: 319 ug/dL (ref 250–450)
UIBC: 242 ug/dL

## 2023-10-17 LAB — CMP (CANCER CENTER ONLY)
ALT: 22 U/L (ref 0–44)
AST: 25 U/L (ref 15–41)
Albumin: 4.3 g/dL (ref 3.5–5.0)
Alkaline Phosphatase: 35 U/L — ABNORMAL LOW (ref 38–126)
Anion gap: 10 (ref 5–15)
BUN: 25 mg/dL — ABNORMAL HIGH (ref 8–23)
CO2: 25 mmol/L (ref 22–32)
Calcium: 8.9 mg/dL (ref 8.9–10.3)
Chloride: 99 mmol/L (ref 98–111)
Creatinine: 1.17 mg/dL (ref 0.61–1.24)
GFR, Estimated: >60 mL/min (ref >60)
Glucose, Bld: 136 mg/dL — ABNORMAL HIGH (ref 70–99)
Potassium: 4 mmol/L (ref 3.5–5.1)
Sodium: 134 mmol/L — ABNORMAL LOW (ref 135–145)
Total Bilirubin: 0.5 mg/dL (ref 0.0–1.2)
Total Protein: 6.8 g/dL (ref 6.5–8.1)

## 2023-10-17 LAB — FERRITIN: Ferritin: 64 ng/mL (ref 24–336)

## 2023-10-17 LAB — LACTATE DEHYDROGENASE: LDH: 130 U/L (ref 98–192)

## 2023-10-17 NOTE — Progress Notes (Signed)
 Fatigue/weakness: YES Dyspena: NO Light headedness: YES Blood in stool: NO  MRI neck 08/29/23, CT shoulder 08/26/23 due to rt arm pain and b/l hand numbness, Sees [facility].  Pt takes OTC iron, can't remember dosage.

## 2023-10-17 NOTE — Progress Notes (Signed)
 [facility] CONSULT NOTE  Patient Care Team: Enid Baas, MD as PCP - General (Internal Medicine) Earna Coder, MD as Consulting Physician (Oncology)  CHIEF COMPLAINTS/PURPOSE OF CONSULTATION: ANEMIA  HEMATOLOGY HISTORY  # ANEMIA[ APRIL- 2023-;PCP; Hb12 platelets- WBC;-2.3 [ANC/Lymphopenia]; Iron sat-24%; ferritin-22 [Lower limit- >23];  GFR-80 CT/US- ;  EGD/colonoscopy-[early 2020; [place]]- MAY 2023- [facility] [mason [facility]]; June 2023 iron saturation 30% ferritin 35; hemolysis labs negative; hepatitis B and C- July 2023-gentle iron; Hx of bone marrow [> 20 years - abnormal blood counts]  # PROSTATE CANCER [2018; "Gleason score=7"] s/p EBRT+ seed implants  HISTORY OF PRESENTING ILLNESS: Alone.  Ambulating independently.  Shane [name] 73 y.o.  male pleasant patient with ongoing fatigue /mild anemia history of prostate cancer is here for follow-up.  Patient had a MRI neck 08/29/23, CT shoulder 08/26/23 due to rt arm pain and b/l hand numbness, Sees [facility].   Pt takes OTC iron, can't remember dosage.  Patient complains of ongoing fatigue.  Otherwise denies any blood in stools or black-colored stools.  Appetite is good. Patient has Chronic constipation, taking miralax. Eats 3 healthy meals per day. Denies any dyspnea. Appetite is good.  No weight loss.   Review of Systems  Constitutional:  Positive for malaise/fatigue. Negative for chills, diaphoresis, fever and weight loss.  HENT:  Negative for nosebleeds and sore throat.   Eyes:  Negative for double vision.  Respiratory:  Negative for cough, hemoptysis, sputum production, shortness of breath and wheezing.   Cardiovascular:  Negative for chest pain, palpitations, orthopnea and leg swelling.  Gastrointestinal:  Positive for heartburn. Negative for abdominal pain, blood in stool, constipation, diarrhea, melena, nausea and vomiting.  Genitourinary:  Negative for dysuria, frequency and urgency.   Musculoskeletal:  Positive for joint pain. Negative for back pain.  Skin: Negative.  Negative for itching and rash.  Neurological:  Negative for dizziness, tingling, focal weakness, weakness and headaches.  Endo/Heme/Allergies:  Does not bruise/bleed easily.  Psychiatric/Behavioral:  Negative for depression. The patient is not nervous/anxious and does not have insomnia.      MEDICAL HISTORY:  History reviewed. No pertinent past medical history.  SURGICAL HISTORY: History reviewed. No pertinent surgical history.  SOCIAL HISTORY: Social History   Socioeconomic History   Marital status: Married    Spouse name: Not on file   Number of children: Not on file   Years of education: Not on file   Highest education level: Not on file  Occupational History   Not on file  Tobacco Use   Smoking status: Never   Smokeless tobacco: Never  Vaping Use   Vaping status: Never Used  Substance and Sexual Activity   Alcohol use: Not Currently   Drug use: Never   Sexual activity: Yes    Birth control/protection: None  Other Topics Concern   Not on file  Social History Narrative      Lives with wife Johnny Bridge and daughter Lanora Manis.  From [place]; worked for [employer]; niece in [place]; 3 children. Never smoked; No alcohol. No inside pets.    Social Drivers of Corporate investment banker Strain: Low Risk  (09/30/2023)   Received from [facility]   Overall Financial Resource Strain (CARDIA)    Difficulty of Paying Living Expenses: Not hard at all  Food Insecurity: No Food Insecurity (09/30/2023)   Received from [facility]   Hunger Vital Sign    Worried About Running Out of Food  in the Last Year: Never true    Ran Out of Food in the Last Year: Never true  Transportation Needs: No Transportation Needs (09/30/2023)   Received from [facility] - Transportation    In the past 12 months, has lack of transportation kept you from medical  appointments or from getting medications?: No    Lack of Transportation (Non-Medical): No  Physical Activity: Not on file  Stress: Not on file  Social Connections: Not on file  Intimate Partner Violence: Not on file    FAMILY HISTORY: Family History  Problem Relation Age of Onset   Diabetes Mother     ALLERGIES:  is allergic to milk protein, peanut-containing drug products, and other.  MEDICATIONS:  Current Outpatient Medications  Medication Sig Dispense Refill   polyethylene glycol powder (GLYCOLAX/MIRALAX) 17 GM/SCOOP powder Take 1 Container by mouth daily.     tamsulosin (FLOMAX) 0.4 MG CAPS capsule Take 1 capsule (0.4 mg total) by mouth daily. 90 capsule 3   No current facility-administered medications for this visit.     Marland Kitchen  PHYSICAL EXAMINATION:   Vitals:   10/17/23 1512  BP: 107/73  Pulse: 81  Resp: 16  Temp: (!) 97.5 F (36.4 C)  SpO2: 100%     Filed Weights   10/17/23 1512  Weight: 140 lb 9.6 oz (63.8 kg)     Physical Exam Vitals and nursing note reviewed.  HENT:     Head: Normocephalic and atraumatic.     Mouth/Throat:     Pharynx: Oropharynx is clear.  Eyes:     Extraocular Movements: Extraocular movements intact.     Pupils: Pupils are equal, round, and reactive to light.  Cardiovascular:     Rate and Rhythm: Normal rate and regular rhythm.  Pulmonary:     Comments: Decreased breath sounds bilaterally.  Abdominal:     Palpations: Abdomen is soft.  Musculoskeletal:        General: Normal range of motion.     Cervical back: Normal range of motion.  Skin:    General: Skin is warm.  Neurological:     General: No focal deficit present.     Mental Status: He is alert and oriented to person, place, and time.  Psychiatric:        Behavior: Behavior normal.        Judgment: Judgment normal.      LABORATORY DATA:  I have reviewed the data as listed Lab Results  Component Value Date   WBC 3.4 (L) 10/17/2023   HGB 11.0 (L) 10/17/2023    HCT 33.4 (L) 10/17/2023   MCV 95.4 10/17/2023   PLT 171 10/17/2023   Recent Labs    03/11/23 1305 04/11/23 1317 10/17/23 1500  NA 135 134* 134*  K 4.1 3.8 4.0  CL 102 102 99  CO2 25 26 25   GLUCOSE 109* 113* 136*  BUN 19 23 25*  CREATININE 1.04 1.11 1.17  CALCIUM 9.2 9.1 8.9  GFRNONAA >60 >60 >60  PROT 7.1  --  6.8  ALBUMIN 4.5  --  4.3  AST 31  --  25  ALT 23  --  22  ALKPHOS 37*  --  35*  BILITOT 0.7  --  0.5     No results found.  ASSESSMENT & PLAN:   Symptomatic anemia #Mild anemia: June 2023- hemoglobin 12; ferritin 35 iron saturation 30; LDH normal no evidence of any hemolysis.  Hepatitis work-up negative.  AUG  2024- ferritin 67 [PCP]; hb 11- WBC 2.3; ANC 1.6; platlets- WNL- # CT scan-July 2023 negative for cirrhosis /splenomegaly negative. EGD/colonoscopy in July 2024- hiatal hernia; gastritis/ duonodenitis- but no bleeding.   # AUG 2024-  bone marrow biopsy"BONE MARROW, ASPIRATE, CLOT, CORE: -  Borderline hypocellular bone marrow with otherwise orderly trilineage hematopoiesis; : The bone marrow is mildly hypocellular for age but with otherwise no significant morphologic abnormality in any of the 3 lineages.  A lymphoid aggregate is identified in the aspirate smear is well; however, there is no immunophenotypic evidence of a lymphoproliferative sorter by flow cytometry. NGS pending- NO abbe rations noted.  Stable.   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   # Elevated B12-elevated; recommend PO B12  q OD.   # Prostate cancer: s/p EBRT [2018]- followed Urologist, [facility]]; AUG 2nd, 2024- 0.1.  # Fatigue: TSH AUg 2024- WNL./ testosterone labs-pending.   # DISPOSITION: # follow up 6 months-MD: labs- cbc/bmp;iron studies; ferritin; LDH- Dr.B      All questions were answered. The patient knows to call the clinic with any problems, questions or concerns.    Earna Coder, MD 10/17/2023 3:43  PM

## 2023-10-17 NOTE — Assessment & Plan Note (Addendum)
#  Mild anemia: June 2023- hemoglobin 12; ferritin 35 iron saturation 30; LDH normal no evidence of any hemolysis.  Hepatitis work-up negative.  AUG 2024- ferritin 67 [PCP]; hb 11- WBC 2.3; ANC 1.6; platlets- WNL- # CT scan-July 2023 negative for cirrhosis /splenomegaly negative. EGD/colonoscopy in July 2024- hiatal hernia; gastritis/ duonodenitis- but no bleeding.   # AUG 2024-  bone marrow biopsy"BONE MARROW, ASPIRATE, CLOT, CORE: -  Borderline hypocellular bone marrow with otherwise orderly trilineage hematopoiesis; : The bone marrow is mildly hypocellular for age but with otherwise no significant morphologic abnormality in any of the 3 lineages.  A lymphoid aggregate is identified in the aspirate smear is well; however, there is no immunophenotypic evidence of a lymphoproliferative sorter by flow cytometry. NGS pending- NO abbe rations noted.  Stable.   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   # Elevated B12-elevated; recommend PO B12  q OD.   # Prostate cancer: s/p EBRT [2018]- followed Urologist, Marlaine [facility]]; AUG 2nd, 2024- 0.1.  # Fatigue: TSH AUg 2024- WNL./ testosterone labs-pending.   # DISPOSITION: # follow up 6 months-MD: labs- cbc/bmp;iron studies; ferritin; LDH- Dr.B

## 2023-10-17 NOTE — Addendum Note (Signed)
 Addended by: Clydia Llano on: 10/17/2023 03:50 PM   Modules accepted: Orders

## 2024-04-17 ENCOUNTER — Inpatient Hospital Stay: Admitting: Internal Medicine

## 2024-04-17 ENCOUNTER — Other Ambulatory Visit

## 2024-04-18 ENCOUNTER — Inpatient Hospital Stay: Attending: Internal Medicine

## 2024-04-18 ENCOUNTER — Inpatient Hospital Stay (HOSPITAL_BASED_OUTPATIENT_CLINIC_OR_DEPARTMENT_OTHER): Admitting: Internal Medicine

## 2024-04-18 ENCOUNTER — Encounter: Payer: Self-pay | Admitting: Internal Medicine

## 2024-04-18 VITALS — BP 110/72 | HR 59 | Temp 96.6°F | Resp 16 | Ht 75.0 in | Wt 141.4 lb

## 2024-04-18 DIAGNOSIS — C61 Malignant neoplasm of prostate: Secondary | ICD-10-CM | POA: Diagnosis not present

## 2024-04-18 DIAGNOSIS — R5383 Other fatigue: Secondary | ICD-10-CM | POA: Diagnosis not present

## 2024-04-18 DIAGNOSIS — D649 Anemia, unspecified: Secondary | ICD-10-CM

## 2024-04-18 DIAGNOSIS — R7989 Other specified abnormal findings of blood chemistry: Secondary | ICD-10-CM | POA: Insufficient documentation

## 2024-04-18 LAB — BASIC METABOLIC PANEL WITH GFR
Anion gap: 6 (ref 5–15)
BUN: 21 mg/dL (ref 8–23)
CO2: 27 mmol/L (ref 22–32)
Calcium: 9.2 mg/dL (ref 8.9–10.3)
Chloride: 104 mmol/L (ref 98–111)
Creatinine, Ser: 1.02 mg/dL (ref 0.61–1.24)
GFR, Estimated: >60 mL/min (ref >60)
Glucose, Bld: 71 mg/dL (ref 70–99)
Potassium: 3.9 mmol/L (ref 3.5–5.1)
Sodium: 137 mmol/L (ref 135–145)

## 2024-04-18 LAB — IRON AND TIBC
Iron: 70 ug/dL (ref 45–182)
Saturation Ratios: 22 % (ref 17.9–39.5)
TIBC: 318 ug/dL (ref 250–450)
UIBC: 248 ug/dL

## 2024-04-18 LAB — CBC WITH DIFFERENTIAL (CANCER CENTER ONLY)
Abs Immature Granulocytes: 0 K/uL (ref 0.00–0.07)
Basophils Absolute: 0 K/uL (ref 0.0–0.1)
Basophils Relative: 0 %
Eosinophils Absolute: 0.1 K/uL (ref 0.0–0.5)
Eosinophils Relative: 3 %
HCT: 34.3 % — ABNORMAL LOW (ref 39.0–52.0)
Hemoglobin: 11.4 g/dL — ABNORMAL LOW (ref 13.0–17.0)
Immature Granulocytes: 0 %
Lymphocytes Relative: 29 %
Lymphs Abs: 0.7 K/uL (ref 0.7–4.0)
MCH: 31.6 pg (ref 26.0–34.0)
MCHC: 33.2 g/dL (ref 30.0–36.0)
MCV: 95 fL (ref 80.0–100.0)
Monocytes Absolute: 0.2 K/uL (ref 0.1–1.0)
Monocytes Relative: 9 %
Neutro Abs: 1.4 K/uL — ABNORMAL LOW (ref 1.7–7.7)
Neutrophils Relative %: 59 %
Platelet Count: 178 K/uL (ref 150–400)
RBC: 3.61 MIL/uL — ABNORMAL LOW (ref 4.22–5.81)
RDW: 13.8 % (ref 11.5–15.5)
WBC Count: 2.3 K/uL — ABNORMAL LOW (ref 4.0–10.5)
nRBC: 0 % (ref 0.0–0.2)

## 2024-04-18 LAB — FERRITIN: Ferritin: 27 ng/mL (ref 24–336)

## 2024-04-18 LAB — LACTATE DEHYDROGENASE: LDH: 128 U/L (ref 98–192)

## 2024-04-18 NOTE — Progress Notes (Signed)
 Fatigue/weakness: YES Dyspena: NO  Light headedness: OCCASIONALLY  Blood in stool: NO   Pt would like to know how his blood counts are today compared to the last check.

## 2024-04-18 NOTE — Assessment & Plan Note (Addendum)
#  Mild anemia: June 2023- hemoglobin 12; ferritin 35 iron  saturation 30; LDH normal no evidence of any hemolysis.  Hepatitis work-up negative.  AUG 2024- ferritin 67 [PCP]; hb 11- WBC 2.3; ANC 1.6; platlets- WNL- # CT scan-July 2023 negative for cirrhosis /splenomegaly negative. EGD/colonoscopy in July 2024- hiatal hernia; gastritis/ duonodenitis- but no bleeding.   # AUG 2024-  bone marrow biopsyBONE MARROW, ASPIRATE, CLOT, CORE: -  Borderline hypocellular bone marrow with otherwise orderly trilineage hematopoiesis; : The bone marrow is mildly hypocellular for age but with otherwise no significant morphologic abnormality in any of the 3 lineages.  A lymphoid aggregate is identified in the aspirate smear is well; however, there is no immunophenotypic evidence of a lymphoproliferative sorter by flow cytometry. NGS pending- NO abberations noted.    # September 2025-labs overall stable-slightly low hemoglobin/and slightly low neutropenia-but overall stable of the last 3 years or so-do not suspect any malignancy or any nutritional deficiency.  If the numbers get worse over time would recommend repeating a bone marrow biopsy for further evaluation.  # Elevated B12-elevated; recommend PO B12  q OD.   # Prostate cancer: s/p EBRT [2018]- [facility]rolog[facility]]; AUG 2nd, 2024- 0.1. continue follow up with urology.   # Fatigue: TSH AUg 2024- WNL./ testosterone  labs-WNL- defer to PCP.   #Since patient is clinically stable I think is reasonable for the patient to follow-up with PCP/can follow-up with us  as needed.  Patient comfortable with the plan; to call us  if any questions or concerns in the interim.   # DISPOSITION: # follow up as needed- Dr.B

## 2024-04-18 NOTE — Progress Notes (Signed)
  [facility] CONSULT NOTE  Patient Care Team: Shane Bail, MD as PCP - General (Internal Medicine) Shane Cindy SAUNDERS, MD as Consulting Physician (Oncology)  CHIEF COMPLAINTS/PURPOSE OF CONSULTATION: ANEMIA  HEMATOLOGY HISTORY  # ANEMIA[ APRIL- 2023-;PCP; Hb12 platelets- WBC;-2.3 [ANC/Lymphopenia]; Iron  sat-24%; ferritin-22 [Lower limit- >23];  GFR-80 CT/US - ;  EGD/colonoscopy-[early 2020; [place]]- MAY 2023- [facility] [m[facility]]; June 2023 iron  saturation 30% ferritin 35; hemolysis labs negative; hepatitis B and C- July 2023-gentle iron ; Hx of bone marrow [> 20 years - abnormal blood counts]  # PROSTATE CANCER [2018; Gleason score=7] s/p EBRT+ seed implants  HISTORY OF PRESENTING ILLNESS: Alone.  Ambulating independently.  Shane [name] 73 y.o.  male pleasant patient with ongoing fatigue /mild leukopenia/anemia history of prostate cancer is here for follow-up.  Patient denies any worsening bone pain or joint pains.  Has chronic neck pain not any worse.  Patient complains of ongoing fatigue.  Otherwise denies any blood in stools or black-colored stools.  Appetite is good. Patient has Chronic constipation, taking miralax.   Review of Systems  Constitutional:  Positive for malaise/fatigue. Negative for chills, diaphoresis, fever and weight loss.  HENT:  Negative for nosebleeds and sore throat.   Eyes:  Negative for double vision.  Respiratory:  Negative for cough, hemoptysis, sputum production, shortness of breath and wheezing.   Cardiovascular:  Negative for chest pain, palpitations, orthopnea and leg swelling.  Gastrointestinal:  Negative for abdominal pain, blood in stool, constipation, diarrhea, melena, nausea and vomiting.  Genitourinary:  Negative for dysuria, frequency and urgency.  Musculoskeletal:  Positive for joint pain. Negative for back pain.  Skin: Negative.  Negative for itching and rash.  Neurological:  Negative for dizziness, tingling, focal  weakness, weakness and headaches.  Endo/Heme/Allergies:  Does not bruise/bleed easily.  Psychiatric/Behavioral:  Negative for depression. The patient is not nervous/anxious and does not have insomnia.      MEDICAL HISTORY:  History reviewed. No pertinent past medical history.  SURGICAL HISTORY: History reviewed. No pertinent surgical history.  SOCIAL HISTORY: Social History   Socioeconomic History   Marital status: Married    Spouse name: Not on file   Number of children: Not on file   Years of education: Not on file   Highest education level: Not on file  Occupational History   Not on file  Tobacco Use   Smoking status: Never   Smokeless tobacco: Never  Vaping Use   Vaping status: Never Used  Substance and Sexual Activity   Alcohol use: Not Currently   Drug use: Never   Sexual activity: Yes    Birth control/protection: None  Other Topics Concern   Not on file  Social History Narrative      Lives with wife Shane [name] and daughter Shane [name].  From [place]; worked for [employer]; niece in [place]; 3 children. Never smoked; No alcohol. No inside pets.    Social Drivers of Corporate investment banker Strain: Low Risk  (03/18/2024)   Received from [facility]   Overall Financial Resource Strain (CARDIA)    Difficulty of Paying Living Expenses: Not hard at all  Food Insecurity: No Food Insecurity (03/18/2024)   [facility]   Hunger Vital Sign    Within the past 12 months, you worried that your food would run out before you got the money to buy more.: Never true    Within the past 12 months, the food you bought just didn't last and you  didn't have money to get more.: Never true  Transportation Needs: No Transportation Needs (03/18/2024)   Received from [facility] - Transportation    In the past 12 months, has lack of transportation kept you from medical appointments or from getting medications?: No     Lack of Transportation (Non-Medical): No  Physical Activity: Sufficiently Active (10/26/2023)   Received from [facility]   Exercise Vital Sign    On average, how many days per week do you engage in moderate to strenuous exercise (like a brisk walk)?: 7 days    On average, how many minutes do you engage in exercise at this level?: 30 min  Stress: No Stress Concern Present (10/26/2023)   Received from [facility] - Occupational Stress Questionnaire    Feeling of Stress : Only a little  Social Connections: Socially Integrated (10/26/2023)   Received from [facility]   Social Connection and Isolation Panel    In a typical week, how many times do you talk on the phone with family, friends, or neighbors?: More than three times a week    How often do you get together with friends or relatives?: More than three times a week    How often do you attend church or religious services?: More than 4 times per year    Do you belong to any clubs or organizations such as church groups, unions, fraternal or athletic groups, or school groups?: Yes    How often do you attend meetings of the clubs or organizations you belong to?: More than 4 times per year    Are you married, widowed, divorced, separated, never married, or living with a partner?: Married  Intimate Partner Violence: Not on file    FAMILY HISTORY: Family History  Problem Relation Age of Onset   Diabetes Mother     ALLERGIES:  is allergic to milk protein, peanut-containing drug products, and other.  MEDICATIONS:  Current Outpatient Medications  Medication Sig Dispense Refill   polyethylene glycol powder (GLYCOLAX/MIRALAX) 17 GM/SCOOP powder Take 1 Container by mouth daily.     tamsulosin  (FLOMAX ) 0.4 MG CAPS capsule Take 1 capsule (0.4 mg total) by mouth daily. 90 capsule 3   No current facility-administered medications for this visit.      [name]  PHYSICAL EXAMINATION:   Vitals:   04/18/24 0835  BP: 110/72  Pulse: (!) 59  Resp: 16  Temp: (!) 96.6 F (35.9 C)  SpO2: 100%     Filed Weights   04/18/24 0835  Weight: 141 lb 6.4 oz (64.1 kg)     Physical Exam Vitals and nursing note reviewed.  HENT:     Head: Normocephalic and atraumatic.     Mouth/Throat:     Pharynx: Oropharynx is clear.  Eyes:     Extraocular Movements: Extraocular movements intact.     Pupils: Pupils are equal, round, and reactive to light.  Cardiovascular:     Rate and Rhythm: Normal rate and regular rhythm.  Pulmonary:     Comments: Decreased breath sounds bilaterally.  Abdominal:     Palpations: Abdomen is soft.  Musculoskeletal:        General: Normal range of motion.     Cervical back: Normal range of motion.  Skin:    General: Skin is warm.  Neurological:     General: No focal deficit present.     Mental Status: He  is alert and oriented to person, place, and time.  Psychiatric:        Behavior: Behavior normal.        Judgment: Judgment normal.      LABORATORY DATA:  I have reviewed the data as listed Lab Results  Component Value Date   WBC 2.3 (L) 04/18/2024   HGB 11.4 (L) 04/18/2024   HCT 34.3 (L) 04/18/2024   MCV 95.0 04/18/2024   PLT 178 04/18/2024   Recent Labs    10/17/23 1500 04/18/24 0829  NA 134* 137  K 4.0 3.9  CL 99 104  CO2 25 27  GLUCOSE 136* 71  BUN 25* 21  CREATININE 1.17 1.02  CALCIUM 8.9 9.2  GFRNONAA >60 >60  PROT 6.8  --   ALBUMIN 4.3  --   AST 25  --   ALT 22  --   ALKPHOS 35*  --   BILITOT 0.5  --      No results found.  ASSESSMENT & PLAN:   Symptomatic anemia #Mild anemia: June 2023- hemoglobin 12; ferritin 35 iron  saturation 30; LDH normal no evidence of any hemolysis.  Hepatitis work-up negative.  AUG 2024- ferritin 67 [PCP]; hb 11- WBC 2.3; ANC 1.6; platlets- WNL- # CT scan-July 2023 negative for cirrhosis /splenomegaly negative. EGD/colonoscopy in July 2024- hiatal  hernia; gastritis/ duonodenitis- but no bleeding.   # AUG 2024-  bone marrow biopsyBONE MARROW, ASPIRATE, CLOT, CORE: -  Borderline hypocellular bone marrow with otherwise orderly trilineage hematopoiesis; : The bone marrow is mildly hypocellular for age but with otherwise no significant morphologic abnormality in any of the 3 lineages.  A lymphoid aggregate is identified in the aspirate smear is well; however, there is no immunophenotypic evidence of a lymphoproliferative sorter by flow cytometry. NGS pending- NO abberations noted.    # September 2025-labs overall stable-slightly low hemoglobin/and slightly low neutropenia-but overall stable of the last 3 years or so-do not suspect any malignancy or any nutritional deficiency.  If the numbers get worse over time would recommend repeating a bone marrow biopsy for further evaluation.  # Elevated B12-elevated; recommend PO B12  q OD.   # Prostate cancer: s/p EBRT [2018]- [facility]rologist[facility]]; AUG 2nd, 2024- 0.1. continue follow up with urology.   # Fatigue: TSH AUg 2024- WNL./ testosterone  labs-WNL- defer to PCP.   #Since patient is clinically stable I think is reasonable for the patient to follow-up with PCP/can follow-up with us  as needed.  Patient comfortable with the plan; to call us  if any questions or concerns in the interim.   # DISPOSITION: # follow up as needed- Dr.B    All questions were answered. The patient knows to call the clinic with any problems, questions or concerns.    Cindy JONELLE Joe, MD 04/18/2024 9:28 AM
